# Patient Record
Sex: Male | Born: 1950
Health system: Southern US, Community
[De-identification: ages and names within clinical notes are randomized; demographics above are authoritative.]

## PROBLEM LIST (undated history)

## (undated) DIAGNOSIS — F419 Anxiety disorder, unspecified: Secondary | ICD-10-CM

## (undated) DIAGNOSIS — N529 Male erectile dysfunction, unspecified: Secondary | ICD-10-CM

## (undated) DIAGNOSIS — H269 Unspecified cataract: Secondary | ICD-10-CM

## (undated) DIAGNOSIS — M8580 Other specified disorders of bone density and structure, unspecified site: Secondary | ICD-10-CM

## (undated) DIAGNOSIS — F329 Major depressive disorder, single episode, unspecified: Secondary | ICD-10-CM

## (undated) DIAGNOSIS — K649 Unspecified hemorrhoids: Secondary | ICD-10-CM

## (undated) DIAGNOSIS — K219 Gastro-esophageal reflux disease without esophagitis: Secondary | ICD-10-CM

## (undated) DIAGNOSIS — N400 Enlarged prostate without lower urinary tract symptoms: Secondary | ICD-10-CM

## (undated) DIAGNOSIS — I10 Essential (primary) hypertension: Secondary | ICD-10-CM

## (undated) DIAGNOSIS — G4700 Insomnia, unspecified: Secondary | ICD-10-CM

## (undated) DIAGNOSIS — E785 Hyperlipidemia, unspecified: Secondary | ICD-10-CM

## (undated) DIAGNOSIS — F32A Depression, unspecified: Secondary | ICD-10-CM

## (undated) HISTORY — DX: Unspecified hemorrhoids: K64.9

## (undated) HISTORY — DX: Male erectile dysfunction, unspecified: N52.9

## (undated) HISTORY — DX: Essential (primary) hypertension: I10

## (undated) HISTORY — PX: FINGER FRACTURE SURGERY: SHX638

## (undated) HISTORY — DX: Benign prostatic hyperplasia without lower urinary tract symptoms: N40.0

## (undated) HISTORY — PX: COLONOSCOPY: SHX174

## (undated) HISTORY — DX: Gastro-esophageal reflux disease without esophagitis: K21.9

## (undated) HISTORY — DX: Insomnia, unspecified: G47.00

## (undated) HISTORY — DX: Other specified disorders of bone density and structure, unspecified site: M85.80

## (undated) HISTORY — DX: Anxiety disorder, unspecified: F41.9

## (undated) HISTORY — DX: Unspecified cataract: H26.9

## (undated) HISTORY — DX: Hyperlipidemia, unspecified: E78.5

---

## 1999-03-17 ENCOUNTER — Emergency Department (HOSPITAL_COMMUNITY): Admission: EM | Admit: 1999-03-17 | Discharge: 1999-03-17 | Payer: Self-pay | Admitting: Emergency Medicine

## 1999-11-11 ENCOUNTER — Observation Stay (HOSPITAL_COMMUNITY): Admission: EM | Admit: 1999-11-11 | Discharge: 1999-11-12 | Payer: Self-pay | Admitting: Emergency Medicine

## 1999-11-11 ENCOUNTER — Encounter: Payer: Self-pay | Admitting: Emergency Medicine

## 2004-02-08 ENCOUNTER — Ambulatory Visit (HOSPITAL_COMMUNITY): Admission: RE | Admit: 2004-02-08 | Discharge: 2004-02-08 | Payer: Self-pay | Admitting: Family Medicine

## 2004-02-22 ENCOUNTER — Ambulatory Visit (HOSPITAL_COMMUNITY): Admission: RE | Admit: 2004-02-22 | Discharge: 2004-02-22 | Payer: Self-pay | Admitting: Family Medicine

## 2008-04-29 ENCOUNTER — Ambulatory Visit: Payer: Self-pay | Admitting: Internal Medicine

## 2008-05-12 ENCOUNTER — Ambulatory Visit: Payer: Self-pay | Admitting: Internal Medicine

## 2012-02-10 ENCOUNTER — Encounter: Payer: Self-pay | Admitting: Internal Medicine

## 2012-02-11 ENCOUNTER — Ambulatory Visit (INDEPENDENT_AMBULATORY_CARE_PROVIDER_SITE_OTHER): Payer: 59 | Admitting: Internal Medicine

## 2012-02-11 ENCOUNTER — Encounter: Payer: Self-pay | Admitting: Internal Medicine

## 2012-02-11 VITALS — BP 150/90 | HR 81 | Temp 98.2°F | Ht 67.0 in | Wt 180.8 lb

## 2012-02-11 DIAGNOSIS — I10 Essential (primary) hypertension: Secondary | ICD-10-CM

## 2012-02-11 DIAGNOSIS — R05 Cough: Secondary | ICD-10-CM

## 2012-02-11 MED ORDER — NEBIVOLOL HCL 5 MG PO TABS: 5.0000 mg | ORAL_TABLET | Freq: Every day | ORAL | Status: DC

## 2012-02-11 MED ORDER — TRAMADOL HCL 50 MG PO TABS: ORAL_TABLET | ORAL | Status: AC

## 2012-02-11 NOTE — Patient Instructions (Addendum)
Most likely the cough at this point is being made worse by your lisinopril which you should stop   bystolic 5 mg one daily   Take delsym two tsp every 12 hours and supplement if needed with  tramadol 50 mg up to 2 every 4 hours to suppress the urge to cough. Swallowing water or using ice chips/non mint and menthol containing candies (such as lifesavers or sugarless jolly ranchers) are also effective.  You should rest your voice and avoid activities that you know make you cough.  Once you have eliminated the cough for 3 straight days try reducing the tramadol first,  then the delsym as tolerated.   Prilosec 20mg  Take 30-60 min before first meal of the day until no longer coughing or needing cough medicine   GERD (REFLUX)  is an extremely common cause of respiratory symptoms(just like yours) , many times with no significant heartburn at all.    It can be treated with medication, but also with lifestyle changes including avoidance of late meals, excessive alcohol, smoking cessation, and avoid fatty foods, chocolate, peppermint, colas, red wine, and acidic juices such as orange juice.  NO MINT OR MENTHOL PRODUCTS SO NO COUGH DROPS  USE SUGARLESS CANDY INSTEAD (jolley ranchers or Stover's)  NO OIL BASED VITAMINS - use powdered substitutes.

## 2012-02-11 NOTE — Progress Notes (Signed)
  Subjective:    Patient ID: Samuel Huang, male    DOB: 02-Nov-1950   MRN: [id number]  HPI  57 yowm quit smoking 1982 and no respiratory issues until March 2013 dx of Influenza B and rx as outpt and coughed ever since so referred 02/11/2012 by Dr Tiburcio Pea to pulmonary clinic.   02/11/2012 1st pulmonary ov/ Samuel Huang on ACEI cc persistent daily dry cough never while sleeping and takes his breathing with gagging from it at one time no assoc nasal symptoms, hb, wheezing and only sob when coughing.  Multiple abx / cough syrups/ pred/inhalers/ nl cxr   Sleeping ok without nocturnal  or early am exacerbation  of respiratory  c/o's or need for noct saba. Also denies any obvious fluctuation of symptoms with weather or environmental changes or other aggravating or alleviating factors except as outlined above   Review of Systems  Constitutional: Positive for appetite change. Negative for fever, chills, activity change and unexpected weight change.  HENT: Negative for ear pain, congestion, sore throat, rhinorrhea, sneezing, trouble swallowing, dental problem, voice change and postnasal drip.   Eyes: Negative for visual disturbance.  Respiratory: Positive for cough and shortness of breath. Negative for choking.   Cardiovascular: Negative for chest pain and leg swelling.  Gastrointestinal: Negative for nausea, vomiting and abdominal pain.  Genitourinary: Negative for difficulty urinating.  Musculoskeletal: Negative for arthralgias.  Skin: Negative for rash.  Psychiatric/Behavioral: Negative for behavioral problems and confusion.       Objective:   Physical Exam Pleasant amb wm with classic voice fatigue and pseudowheeze Wt 180 02/11/2012  HEENT: nl dentition, turbinates, and orophanx. Nl external ear canals without cough reflex   NECK :  without JVD/Nodes/TM/ nl carotid upstrokes bilaterally   LUNGS: no acc muscle use, clear to A and P bilaterally without cough on insp or exp maneuvers   CV:  RRR   no s3 or murmur or increase in P2, no edema   ABD:  soft and nontender with nl excursion in the supine position. No bruits or organomegaly, bowel sounds nl  MS:  warm without deformities, calf tenderness, cyanosis or clubbing  SKIN: warm and dry without lesions    NEURO:  alert, approp, no deficits   cxr 01/21/12 "Neg chest" per radiology report       Assessment & Plan:

## 2012-02-12 DIAGNOSIS — I10 Essential (primary) hypertension: Secondary | ICD-10-CM | POA: Insufficient documentation

## 2012-02-12 NOTE — Assessment & Plan Note (Signed)
ACE inhibitors are problematic in  pts with airway complaints because  even experienced pulmonologists can't always distinguish ace effects from copd/asthma/pnds/ allergies etc.  By themselves they don't actually cause a problem, much like oxygen can't by itself start a fire, but they certainly serve as a powerful catalyst or enhancer for any "fire"  or inflammatory process in the upper airway, be it caused by an ET  tube or more commonly reflux (especially in the obese or pts with known GERD or who are on biphoshonates) or URI's, due to interference with bradykinin clearance.  The effects of acei on bradykinin levels occurs in 100% of pt's on acei (unless they surreptitiously stop the med!) but the classic cough is only reported in 5%.  This leaves 95% of pts on acei's  with a variety of syndromes including no identifiable symptom in most  vs non-specific symptoms that wax and wane depending on what other insult is occuring at the level of the upper airway.   Try change to bystolic 5 mg daily and then f/u with Dr Dominica Severin if cough resolves for maint rx for hbp off ACEI indefinitely

## 2012-02-12 NOTE — Assessment & Plan Note (Signed)
The most common causes of chronic cough in immunocompetent adults include the following: upper airway cough syndrome (UACS), previously referred to as postnasal drip syndrome (PNDS), which is caused by variety of rhinosinus conditions; (2) asthma; (3) GERD; (4) chronic bronchitis from cigarette smoking or other inhaled environmental irritants; (5) nonasthmatic eosinophilic bronchitis; and (6) bronchiectasis.   These conditions, singly or in combination, have accounted for up to 94% of the causes of chronic cough in prospective studies.   Other conditions have constituted no >6% of the causes in prospective studies These have included bronchogenic carcinoma, chronic interstitial pneumonia, sarcoidosis, left ventricular failure, ACEI-induced cough, and aspiration from a condition associated with pharyngeal dysfunction.   .Chronic cough is often simultaneously caused by more than one condition. A single cause has been found from 38 to 82% of the time, multiple causes from 18 to 62%. Multiply caused cough has been the result of three diseases up to 42% of the time.     This is most likely  Classic Upper airway cough syndrome, so named because it's frequently impossible to sort out how much is  CR/sinusitis with freq throat clearing (which can be related to primary GERD)   vs  causing  secondary (" extra esophageal")  GERD from wide swings in gastric pressure that occur with throat clearing, often  promoting self use of mint and menthol lozenges that reduce the lower esophageal sphincter tone and exacerbate the problem further in a cyclical fashion.   These are the same pts (now being labeled as having "irritable larynx syndrome" by some cough centers) who not infrequently have a history of having failed to tolerate ace inhibitors,  dry powder inhalers or biphosphonates or report having atypical reflux symptoms that don't respond to standard doses of PPI , and are easily confused as having aecopd or asthma  flares by even experienced allergists/ pulmonologists.   He is on acei and probably has cough induced gerd induced cough cycle going now, with acei interfering with bradykinin clearance and fueling the fire on this basis > try off then regroup.  See instructions for specific recommendations which were reviewed directly with the patient who was given a copy with highlighter outlining the key components.

## 2013-04-30 ENCOUNTER — Ambulatory Visit
Admission: RE | Admit: 2013-04-30 | Discharge: 2013-04-30 | Disposition: A | Discharge status: Home / Self Care | Payer: BC Managed Care – PPO | Source: Ambulatory Visit | Attending: Family Medicine | Admitting: Family Medicine

## 2013-04-30 ENCOUNTER — Other Ambulatory Visit: Payer: Self-pay | Admitting: Family Medicine

## 2013-04-30 DIAGNOSIS — M5412 Radiculopathy, cervical region: Secondary | ICD-10-CM

## 2014-06-01 ENCOUNTER — Encounter (HOSPITAL_BASED_OUTPATIENT_CLINIC_OR_DEPARTMENT_OTHER): Payer: Self-pay | Admitting: Emergency Medicine

## 2014-06-01 ENCOUNTER — Emergency Department (HOSPITAL_BASED_OUTPATIENT_CLINIC_OR_DEPARTMENT_OTHER)
Admission: EM | Admit: 2014-06-01 | Discharge: 2014-06-01 | Disposition: A | Discharge status: Home / Self Care | Payer: BC Managed Care – PPO | Attending: Emergency Medicine | Admitting: Emergency Medicine

## 2014-06-01 DIAGNOSIS — E785 Hyperlipidemia, unspecified: Secondary | ICD-10-CM | POA: Insufficient documentation

## 2014-06-01 DIAGNOSIS — Z79899 Other long term (current) drug therapy: Secondary | ICD-10-CM | POA: Insufficient documentation

## 2014-06-01 DIAGNOSIS — Z8739 Personal history of other diseases of the musculoskeletal system and connective tissue: Secondary | ICD-10-CM | POA: Diagnosis not present

## 2014-06-01 DIAGNOSIS — F32A Depression, unspecified: Secondary | ICD-10-CM

## 2014-06-01 DIAGNOSIS — F3289 Other specified depressive episodes: Secondary | ICD-10-CM | POA: Insufficient documentation

## 2014-06-01 DIAGNOSIS — I10 Essential (primary) hypertension: Secondary | ICD-10-CM | POA: Diagnosis not present

## 2014-06-01 DIAGNOSIS — Z7982 Long term (current) use of aspirin: Secondary | ICD-10-CM | POA: Diagnosis not present

## 2014-06-01 DIAGNOSIS — F411 Generalized anxiety disorder: Secondary | ICD-10-CM | POA: Insufficient documentation

## 2014-06-01 DIAGNOSIS — F329 Major depressive disorder, single episode, unspecified: Secondary | ICD-10-CM

## 2014-06-01 DIAGNOSIS — Z87891 Personal history of nicotine dependence: Secondary | ICD-10-CM | POA: Insufficient documentation

## 2014-06-01 DIAGNOSIS — Z880 Allergy status to penicillin: Secondary | ICD-10-CM | POA: Insufficient documentation

## 2014-06-01 DIAGNOSIS — Z8719 Personal history of other diseases of the digestive system: Secondary | ICD-10-CM | POA: Diagnosis not present

## 2014-06-01 HISTORY — DX: Major depressive disorder, single episode, unspecified: F32.9

## 2014-06-01 HISTORY — DX: Depression, unspecified: F32.A

## 2014-06-01 MED ORDER — DULOXETINE HCL 30 MG PO CPEP: 30.0000 mg | ORAL_CAPSULE | Freq: Every day | ORAL | Status: DC

## 2014-06-01 NOTE — ED Provider Notes (Signed)
CSN: [id number]     Arrival date & time 06/01/14  0849 History   First MD Initiated Contact with Patient 06/01/14 [phone]     Chief Complaint  Patient presents with  . Depression      The history is provided by the patient.   patient presents with depressive symptoms.  Reports anorexia, tearfulness, insomnia.  He states this has been worsening over the past month.  He states he is worried about his upcoming retirement.  He also has a new boss has been causing a lot of stress at work secondary to performance measures.  Patient states he has a history depression 25 years ago.  He denies suicidal or homicidal thoughts.  No hallucinations.  He called his primary care doctor but can't until next week.  He states he had difficulty sleeping last night.  Past Medical History  Diagnosis Date  . Hypertension   . GERD (gastroesophageal reflux disease)   . Hyperlipidemia   . Anxiety   . Osteopenia   . Vitamin D deficiency   . Depression    History reviewed. No pertinent past surgical history. No family history on file. History  Substance Use Topics  . Smoking status: Former Smoker -- 0.30 packs/day for 3 years    Types: Cigarettes    Quit date: 10/21/1978  . Smokeless tobacco: Never Used  . Alcohol Use: Yes     Comment: occ ETOH only    Review of Systems  All other systems reviewed and are negative.     Allergies  Penicillins  Home Medications   Prior to Admission medications   Medication Sig Start Date End Date Taking? Authorizing Provider  aspirin 81 MG tablet Take 81 mg by mouth at bedtime.    Historical Provider, MD  DULoxetine (CYMBALTA) 30 MG capsule Take 1 capsule (30 mg total) by mouth daily. 06/01/14   Lyanne CoKevin M Robertt Buda, MD  nebivolol (BYSTOLIC) 5 MG tablet Take 1 tablet (5 mg total) by mouth daily. 02/11/12   Nyoka CowdenMichael B Wert, MD  simvastatin (ZOCOR) 20 MG tablet Take 1 tablet by mouth daily. 01/29/12   Historical Provider, MD   BP 167/86  Pulse 58  Temp(Src) 98 F (36.7 C)  (Oral)  Resp 16  Ht 5\' 7"  (1.702 m)  Wt 176 lb (79.833 kg)  BMI 27.56 kg/m2  SpO2 98% Physical Exam  Nursing note and vitals reviewed. Constitutional: He is oriented to person, place, and time. He appears well-developed and well-nourished.  HENT:  Head: Normocephalic.  Eyes: EOM are normal.  Neck: Normal range of motion.  Pulmonary/Chest: Effort normal.  Abdominal: He exhibits no distension.  Musculoskeletal: Normal range of motion.  Neurological: He is alert and oriented to person, place, and time.  Psychiatric:  Tearful, good insight. No HI or SI    ED Course  Procedures (including critical care time) Labs Review Labs Reviewed - No data to display  Imaging Review No results found.   EKG Interpretation None      MDM   Final diagnoses:  Depression    Depression without suicidal or homicidal thoughts.  No indication for inpatient management.  Patient given strict instructions to return to the ER for any new or worsening symptoms including but not limited to suicidal thoughts.  Patient be started on an antidepressant at this time.  He understands the potential for increased suicidal thoughts while initiating this new medication    Lyanne CoKevin M Amayah Staheli, MD 06/01/14 [phone]

## 2014-06-01 NOTE — Discharge Instructions (Signed)

## 2014-06-01 NOTE — ED Notes (Signed)
MD at bedside. 

## 2014-06-01 NOTE — ED Notes (Addendum)
Depreesion x 1 month and past 2 days have been worse.  Crying, insomnia, disinterest in activities, decreased appetite and stressed about upcoming retirement.  Denies SI, however states he wishes he could "break my arm or something so I wouldn't have to go to work". Hx depression 25 years ago.

## 2015-01-31 ENCOUNTER — Other Ambulatory Visit: Payer: Self-pay | Admitting: Family Medicine

## 2015-01-31 ENCOUNTER — Ambulatory Visit
Admission: RE | Admit: 2015-01-31 | Discharge: 2015-01-31 | Disposition: A | Discharge status: Home / Self Care | Payer: 59 | Source: Ambulatory Visit | Attending: Family Medicine | Admitting: Family Medicine

## 2015-01-31 DIAGNOSIS — R059 Cough, unspecified: Secondary | ICD-10-CM

## 2015-01-31 DIAGNOSIS — R509 Fever, unspecified: Secondary | ICD-10-CM

## 2015-01-31 DIAGNOSIS — R05 Cough: Secondary | ICD-10-CM

## 2015-11-28 DIAGNOSIS — M722 Plantar fascial fibromatosis: Secondary | ICD-10-CM | POA: Diagnosis not present

## 2015-11-28 DIAGNOSIS — E782 Mixed hyperlipidemia: Secondary | ICD-10-CM | POA: Diagnosis not present

## 2015-11-28 DIAGNOSIS — E559 Vitamin D deficiency, unspecified: Secondary | ICD-10-CM | POA: Diagnosis not present

## 2015-11-28 DIAGNOSIS — I10 Essential (primary) hypertension: Secondary | ICD-10-CM | POA: Diagnosis not present

## 2015-11-28 DIAGNOSIS — N529 Male erectile dysfunction, unspecified: Secondary | ICD-10-CM | POA: Diagnosis not present

## 2016-07-04 DIAGNOSIS — L309 Dermatitis, unspecified: Secondary | ICD-10-CM | POA: Diagnosis not present

## 2016-07-04 DIAGNOSIS — I10 Essential (primary) hypertension: Secondary | ICD-10-CM | POA: Diagnosis not present

## 2016-07-04 DIAGNOSIS — E782 Mixed hyperlipidemia: Secondary | ICD-10-CM | POA: Diagnosis not present

## 2016-07-04 DIAGNOSIS — E559 Vitamin D deficiency, unspecified: Secondary | ICD-10-CM | POA: Diagnosis not present

## 2016-11-01 DIAGNOSIS — R109 Unspecified abdominal pain: Secondary | ICD-10-CM | POA: Diagnosis not present

## 2016-11-01 DIAGNOSIS — R1013 Epigastric pain: Secondary | ICD-10-CM | POA: Diagnosis not present

## 2016-11-19 ENCOUNTER — Encounter: Payer: Self-pay | Admitting: Internal Medicine

## 2016-12-17 ENCOUNTER — Other Ambulatory Visit: Payer: Self-pay

## 2016-12-27 ENCOUNTER — Ambulatory Visit (INDEPENDENT_AMBULATORY_CARE_PROVIDER_SITE_OTHER): Payer: 59 | Admitting: Internal Medicine

## 2016-12-27 ENCOUNTER — Encounter (INDEPENDENT_AMBULATORY_CARE_PROVIDER_SITE_OTHER): Payer: Self-pay

## 2016-12-27 ENCOUNTER — Encounter: Payer: Self-pay | Admitting: Internal Medicine

## 2016-12-27 VITALS — BP 148/78 | HR 72 | Ht 67.0 in | Wt 180.0 lb

## 2016-12-27 DIAGNOSIS — A09 Infectious gastroenteritis and colitis, unspecified: Secondary | ICD-10-CM

## 2016-12-27 DIAGNOSIS — K529 Noninfective gastroenteritis and colitis, unspecified: Secondary | ICD-10-CM

## 2016-12-27 DIAGNOSIS — K588 Other irritable bowel syndrome: Secondary | ICD-10-CM | POA: Diagnosis not present

## 2016-12-27 NOTE — Progress Notes (Signed)
   Samuel CulverCharles M Huang 66 y.o. [birth year] [id number]  Assessment & Plan:   Encounter Diagnoses  Name Primary?  . Gastroenteritis presumed infectious Yes  . irritable bowel syndrome - post infectious     I think he is recovering from gastroenteritis x 1-2 (seems like had second bout w/ food poisoning)  If he fails to resolve he will call me back and we can consider colonoscopy but I suspect he can wait until 2019 for routine repeat based upon what I know today  I appreciate the opportunity to care for this patient.  BJ:YNWGNF,AOZHYCc:SWAYNE,DAVID W, MD  Subjective:   Chief Complaint: LUQ pain and diarrhea  HPI In Jan he had nausea/vomitinh LUQ pain and diarrhea Saw PCP - labs ok Tried some medication x 10 d ? Abx) not much better but eventually less loose stools and lUQ pain but still has some pc bloating, gas and abd discomfort LUQ Appetite ok Noone else sick then but 1 week ago he and wife ill x 24 hrs w/ nausea vomiting and diarrhea No recent Abx before Jan and no med changes Last colonoscopy 2009 negative screening  GI ROS o/w neg  Medications, allergies, past medical history, past surgical history, family history and social history are reviewed and updated in the EMR.  Review of Systems This is positive for those things mentiones in the HPI. All other review of systems are negative.   Objective:   Physical Exam @BP  (!) 148/78   Pulse 72   Ht 5\' 7"  (1.702 m)   Wt 180 lb (81.6 kg)   BMI 28.19 kg/m @  General:  Well-developed, well-nourished and in no acute distress Eyes:  anicteric. ENT:   Mouth and posterior pharynx free of lesions.  Neck:   supple w/o thyromegaly or mass.  Lungs: Clear to auscultation bilaterally. Heart:  S1S2, no rubs, murmurs, gallops. Abdomen:  soft, non-tender, no hepatosplenomegaly, hernia, or mass and BS+.  Lymph:  no cervical or supraclavicular adenopathy. Extremities:   no edema, cyanosis or clubbing Skin   no rash. Neuro:  A&O x 3.  Psych:    appropriate mood and  Affect.   Data Reviewed: As per HPI Labs 10/2016 NL Hgb  NL renal fx NL LFT

## 2016-12-27 NOTE — Patient Instructions (Signed)
Today we are giving you VSL #3 , take one daily and follow up with us as needed.   I appreciate the opportunity to care for you. Stan Headarl Gessner, MD, [facility] Facility[facility]AC[facility]

## 2016-12-28 ENCOUNTER — Encounter: Payer: Self-pay | Admitting: Internal Medicine

## 2017-02-18 DIAGNOSIS — N529 Male erectile dysfunction, unspecified: Secondary | ICD-10-CM | POA: Diagnosis not present

## 2017-02-18 DIAGNOSIS — Z1389 Encounter for screening for other disorder: Secondary | ICD-10-CM | POA: Diagnosis not present

## 2017-02-18 DIAGNOSIS — Z125 Encounter for screening for malignant neoplasm of prostate: Secondary | ICD-10-CM | POA: Diagnosis not present

## 2017-02-18 DIAGNOSIS — E782 Mixed hyperlipidemia: Secondary | ICD-10-CM | POA: Diagnosis not present

## 2017-02-18 DIAGNOSIS — Z0000 Encounter for general adult medical examination without abnormal findings: Secondary | ICD-10-CM | POA: Diagnosis not present

## 2017-02-18 DIAGNOSIS — Z136 Encounter for screening for cardiovascular disorders: Secondary | ICD-10-CM | POA: Diagnosis not present

## 2017-02-18 DIAGNOSIS — R7309 Other abnormal glucose: Secondary | ICD-10-CM | POA: Diagnosis not present

## 2017-02-18 DIAGNOSIS — I10 Essential (primary) hypertension: Secondary | ICD-10-CM | POA: Diagnosis not present

## 2017-02-18 DIAGNOSIS — Z1211 Encounter for screening for malignant neoplasm of colon: Secondary | ICD-10-CM | POA: Diagnosis not present

## 2017-02-18 DIAGNOSIS — M8588 Other specified disorders of bone density and structure, other site: Secondary | ICD-10-CM | POA: Diagnosis not present

## 2017-02-18 DIAGNOSIS — Z23 Encounter for immunization: Secondary | ICD-10-CM | POA: Diagnosis not present

## 2017-02-18 DIAGNOSIS — E559 Vitamin D deficiency, unspecified: Secondary | ICD-10-CM | POA: Diagnosis not present

## 2017-02-20 ENCOUNTER — Other Ambulatory Visit: Payer: Self-pay | Admitting: Family Medicine

## 2017-02-20 DIAGNOSIS — Z136 Encounter for screening for cardiovascular disorders: Secondary | ICD-10-CM

## 2017-03-11 ENCOUNTER — Ambulatory Visit: Payer: 59

## 2017-03-14 ENCOUNTER — Ambulatory Visit
Admission: RE | Admit: 2017-03-14 | Discharge: 2017-03-14 | Disposition: A | Discharge status: Home / Self Care | Payer: 59 | Source: Ambulatory Visit | Attending: Family Medicine | Admitting: Family Medicine

## 2017-03-14 DIAGNOSIS — Z136 Encounter for screening for cardiovascular disorders: Secondary | ICD-10-CM

## 2017-03-31 DIAGNOSIS — M8588 Other specified disorders of bone density and structure, other site: Secondary | ICD-10-CM | POA: Diagnosis not present

## 2017-09-16 DIAGNOSIS — R1031 Right lower quadrant pain: Secondary | ICD-10-CM | POA: Diagnosis not present

## 2017-09-16 DIAGNOSIS — N529 Male erectile dysfunction, unspecified: Secondary | ICD-10-CM | POA: Diagnosis not present

## 2017-09-16 DIAGNOSIS — E782 Mixed hyperlipidemia: Secondary | ICD-10-CM | POA: Diagnosis not present

## 2017-09-16 DIAGNOSIS — E559 Vitamin D deficiency, unspecified: Secondary | ICD-10-CM | POA: Diagnosis not present

## 2017-09-16 DIAGNOSIS — Z23 Encounter for immunization: Secondary | ICD-10-CM | POA: Diagnosis not present

## 2017-09-16 DIAGNOSIS — N486 Induration penis plastica: Secondary | ICD-10-CM | POA: Diagnosis not present

## 2017-09-16 DIAGNOSIS — M85851 Other specified disorders of bone density and structure, right thigh: Secondary | ICD-10-CM | POA: Diagnosis not present

## 2017-09-16 DIAGNOSIS — R7303 Prediabetes: Secondary | ICD-10-CM | POA: Diagnosis not present

## 2017-09-16 DIAGNOSIS — I10 Essential (primary) hypertension: Secondary | ICD-10-CM | POA: Diagnosis not present

## 2017-09-16 DIAGNOSIS — R945 Abnormal results of liver function studies: Secondary | ICD-10-CM | POA: Diagnosis not present

## 2017-10-02 DIAGNOSIS — R945 Abnormal results of liver function studies: Secondary | ICD-10-CM | POA: Diagnosis not present

## 2017-11-17 DIAGNOSIS — Z125 Encounter for screening for malignant neoplasm of prostate: Secondary | ICD-10-CM | POA: Diagnosis not present

## 2017-11-17 DIAGNOSIS — N486 Induration penis plastica: Secondary | ICD-10-CM | POA: Diagnosis not present

## 2017-11-17 DIAGNOSIS — N5201 Erectile dysfunction due to arterial insufficiency: Secondary | ICD-10-CM | POA: Diagnosis not present

## 2018-05-05 DIAGNOSIS — E1169 Type 2 diabetes mellitus with other specified complication: Secondary | ICD-10-CM | POA: Diagnosis not present

## 2018-05-05 DIAGNOSIS — Z125 Encounter for screening for malignant neoplasm of prostate: Secondary | ICD-10-CM | POA: Diagnosis not present

## 2018-05-05 DIAGNOSIS — Z1211 Encounter for screening for malignant neoplasm of colon: Secondary | ICD-10-CM | POA: Diagnosis not present

## 2018-05-05 DIAGNOSIS — E782 Mixed hyperlipidemia: Secondary | ICD-10-CM | POA: Diagnosis not present

## 2018-05-05 DIAGNOSIS — N529 Male erectile dysfunction, unspecified: Secondary | ICD-10-CM | POA: Diagnosis not present

## 2018-05-05 DIAGNOSIS — Z23 Encounter for immunization: Secondary | ICD-10-CM | POA: Diagnosis not present

## 2018-05-05 DIAGNOSIS — M85851 Other specified disorders of bone density and structure, right thigh: Secondary | ICD-10-CM | POA: Diagnosis not present

## 2018-05-05 DIAGNOSIS — Z0000 Encounter for general adult medical examination without abnormal findings: Secondary | ICD-10-CM | POA: Diagnosis not present

## 2018-05-05 DIAGNOSIS — I10 Essential (primary) hypertension: Secondary | ICD-10-CM | POA: Diagnosis not present

## 2018-05-05 DIAGNOSIS — E559 Vitamin D deficiency, unspecified: Secondary | ICD-10-CM | POA: Diagnosis not present

## 2018-06-01 ENCOUNTER — Encounter: Payer: Self-pay | Admitting: Internal Medicine

## 2018-07-23 ENCOUNTER — Encounter: Payer: Medicare Other | Admitting: Internal Medicine

## 2018-08-09 IMAGING — US US AORTA SCREENING (MEDICARE)
1 series · 12 of 12 positions shown · non-contrast
Comparison: CT abdomen and pelvis - 02/08/2004

CLINICAL DATA: Male between 65-75 years of age with a smoking
history.

EXAM:
US ABDOMINAL AORTA MEDICARE SCREENING
TECHNIQUE: Ultrasound examination of the abdominal aorta was performed as a
screening evaluation for abdominal aortic aneurysm.

[Series 1: us aorta screening (medicare) · 0.30mm/px · 12 of 12 slices shown]
[im 1/12]
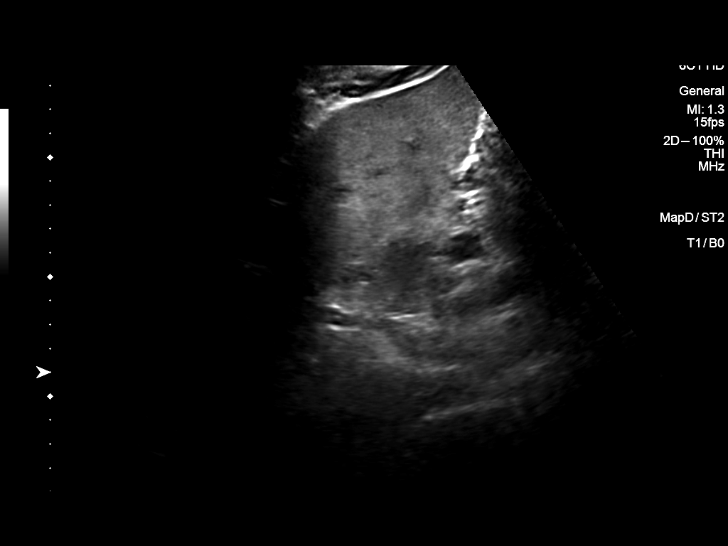
[im 2/12]
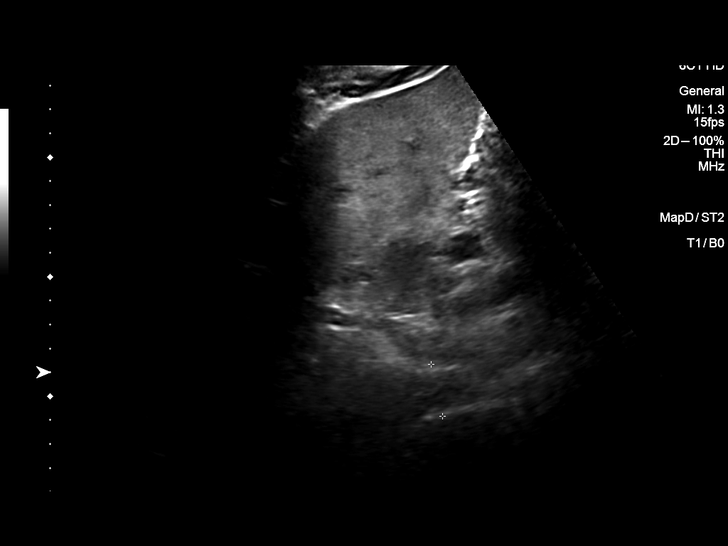
[im 3/12]
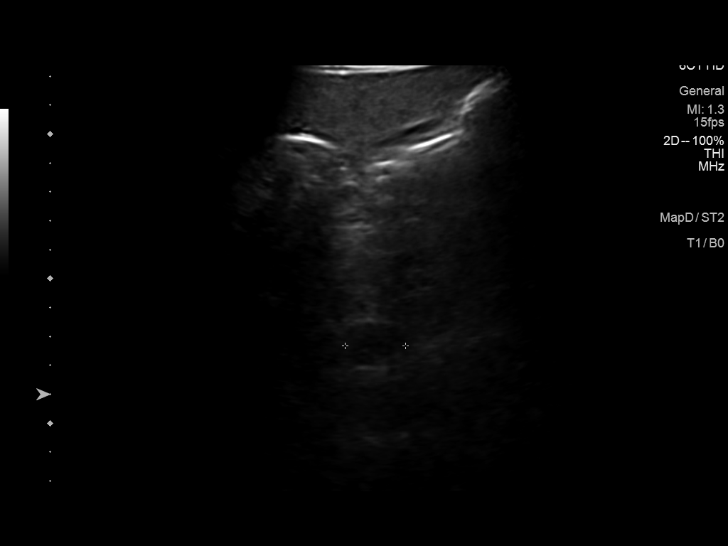
[im 4/12]
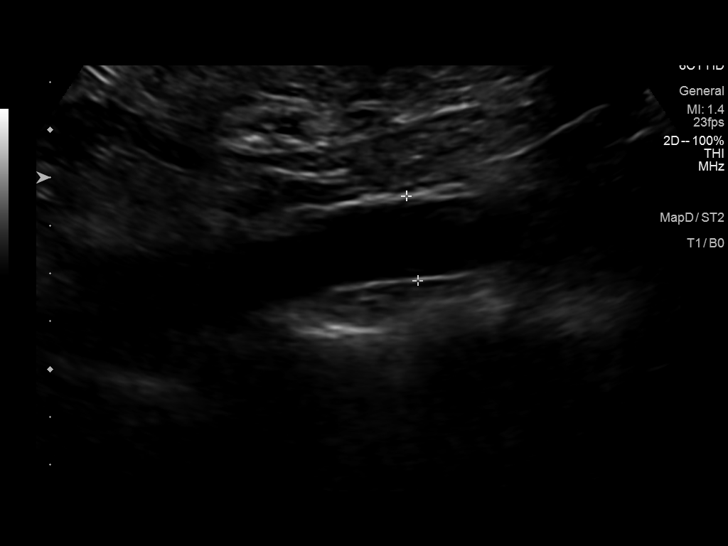
[im 5/12]
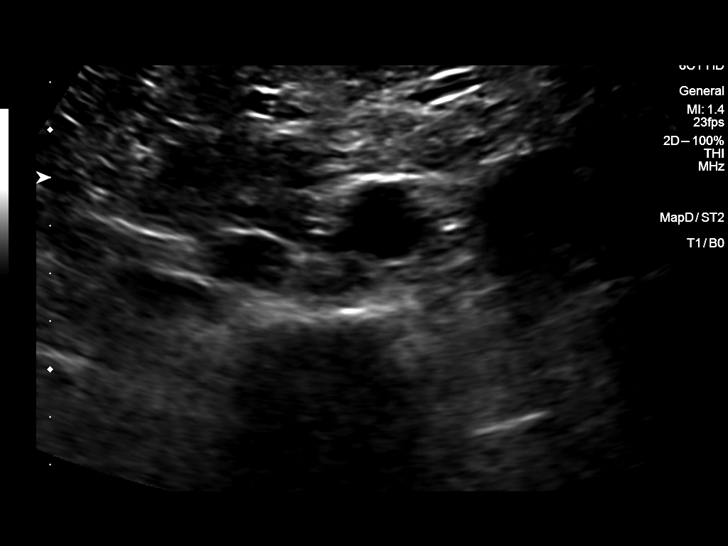
[im 6/12]
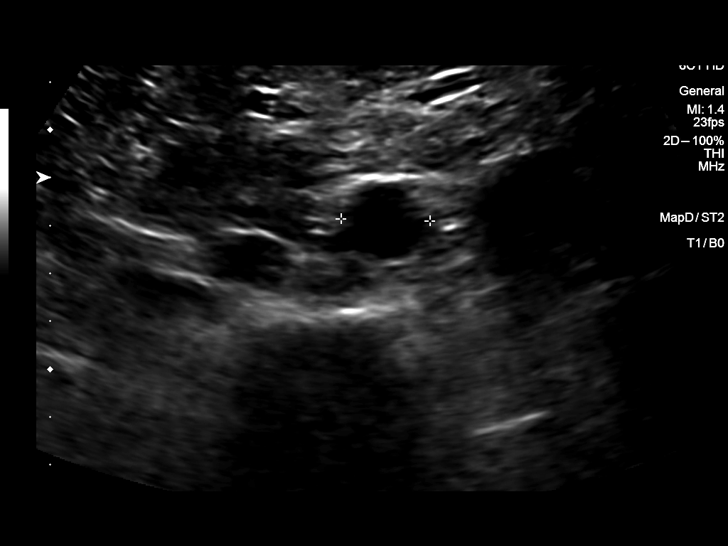
[im 7/12]
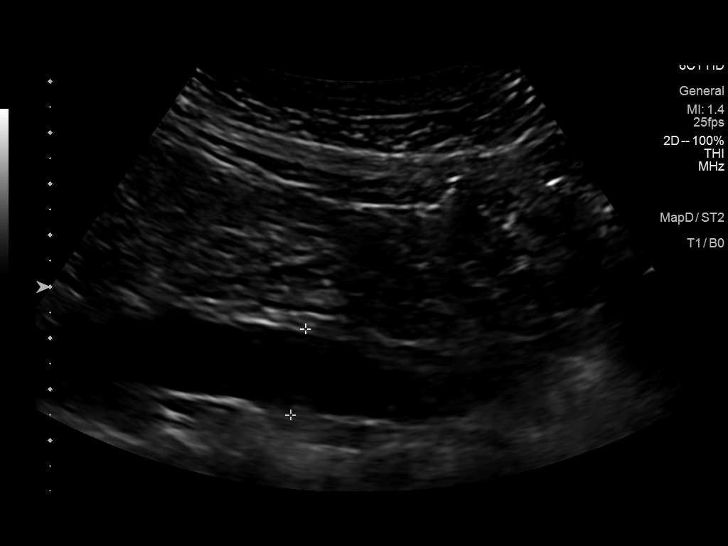
[im 8/12]
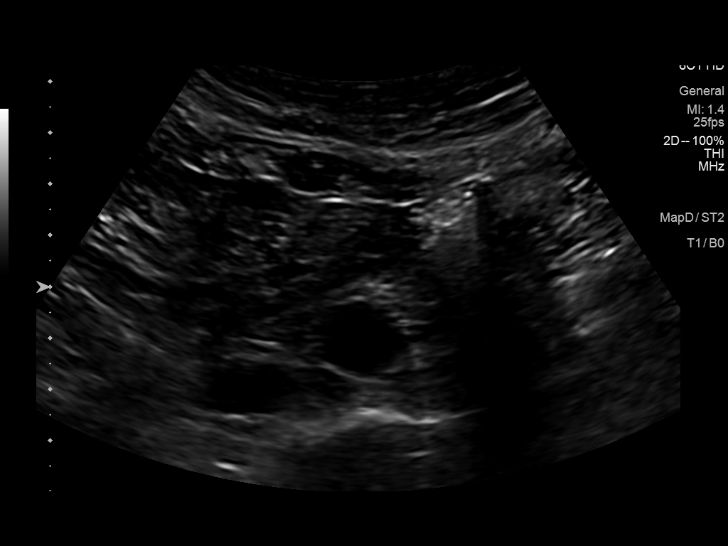
[im 9/12]
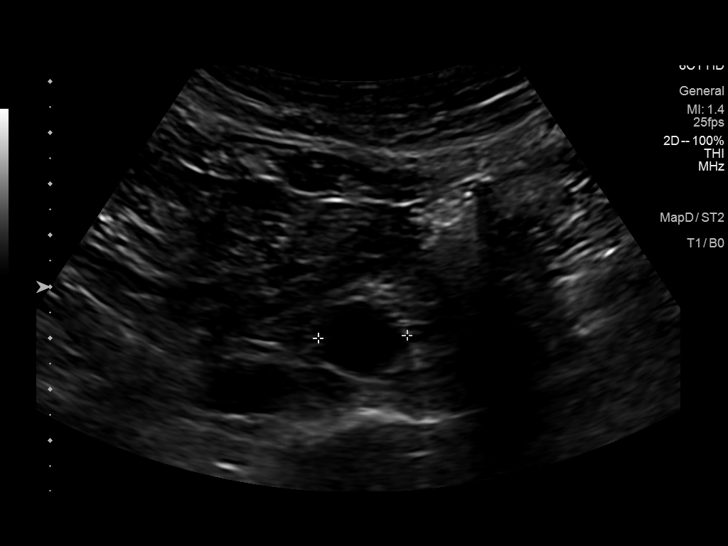
[im 10/12]
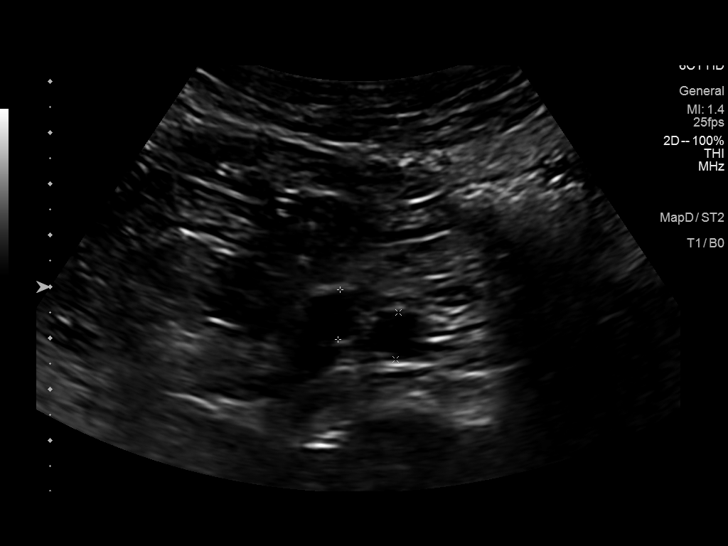
[im 11/12]
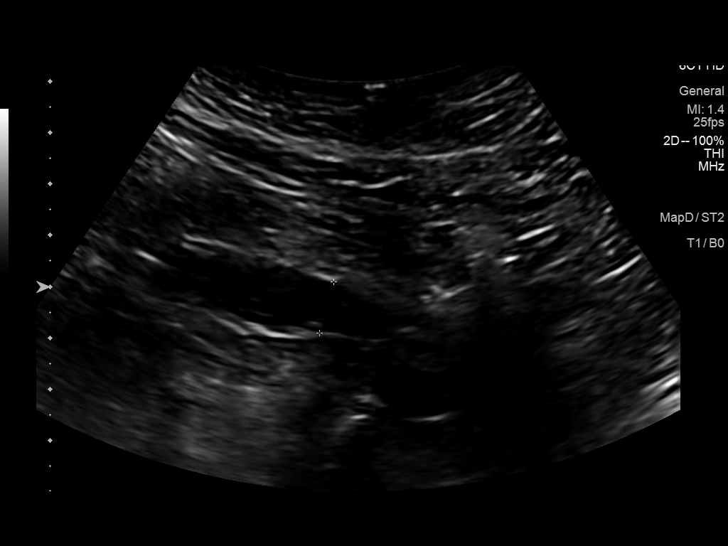
[im 12/12]
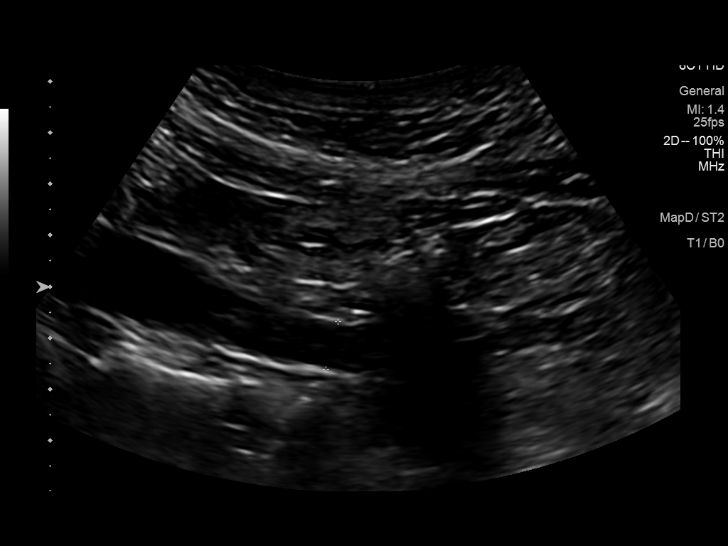

[12 of 12 positions shown; findings below may reference images not displayed]

FINDINGS: Abdominal aortic measurements as follows:

Proximal:  2.2 x 2.1 cm

Mid:  1.8 x 1.9 cm

Distal:  1.7 x 1.7 cm
IMPRESSION: No evidence of abdominal aortic aneurysm.

## 2018-08-14 ENCOUNTER — Encounter: Payer: Self-pay | Admitting: Internal Medicine

## 2018-09-08 ENCOUNTER — Encounter: Payer: Self-pay | Admitting: Internal Medicine

## 2018-09-08 ENCOUNTER — Ambulatory Visit (AMBULATORY_SURGERY_CENTER): Payer: Self-pay

## 2018-09-08 VITALS — Ht 66.0 in | Wt 179.6 lb

## 2018-09-08 DIAGNOSIS — Z1211 Encounter for screening for malignant neoplasm of colon: Secondary | ICD-10-CM

## 2018-09-08 NOTE — Progress Notes (Signed)
Denies allergies to eggs or soy products. Denies complication of anesthesia or sedation. Denies use of weight loss medication. Denies use of O2.   Emmi instructions declined.  

## 2018-09-21 ENCOUNTER — Encounter: Payer: Self-pay | Admitting: Internal Medicine

## 2018-09-21 ENCOUNTER — Ambulatory Visit (AMBULATORY_SURGERY_CENTER): Payer: 59 | Admitting: Internal Medicine

## 2018-09-21 VITALS — BP 111/67 | HR 66 | Temp 97.5°F | Resp 13 | Ht 66.0 in | Wt 179.0 lb

## 2018-09-21 DIAGNOSIS — Z1211 Encounter for screening for malignant neoplasm of colon: Secondary | ICD-10-CM | POA: Diagnosis not present

## 2018-09-21 MED ORDER — SODIUM CHLORIDE 0.9 % IV SOLN: 500.0000 mL | Freq: Once | INTRAVENOUS | Status: DC

## 2018-09-21 NOTE — Patient Instructions (Addendum)
No polyps or cancer seen.  Based upon your history (no polyps) and that in 10 years you would be older than 75 I do not recommend a routine repeat colonoscopy.  If you have problems then we would investigate (hope that is not necessary).  I appreciate the opportunity to care for you. Iva Booparl E. Gessner, MD, Clement[facility] GrahamFACG  Handouts:  Diverticulosis  YOU HAD AN ENDOSCOPIC PROCEDURE TODAY AT THE [facility]:   Refer to the procedure report that was given to you for any specific questions about what was found during the examination.  If the procedure report does not answer your questions, please call your gastroenterologist to clarify.  If you requested that your care partner not be given the details of your procedure findings, then the procedure report has been included in a sealed envelope for you to review at your convenience later.  YOU SHOULD EXPECT: Some feelings of bloating in the abdomen. Passage of more gas than usual.  Walking can help get rid of the air that was put into your GI tract during the procedure and reduce the bloating. If you had a lower endoscopy (such as a colonoscopy or flexible sigmoidoscopy) you may notice spotting of blood in your stool or on the toilet paper. If you underwent a bowel prep for your procedure, you may not have a normal bowel movement for a few days.  Please Note:  You might notice some irritation and congestion in your nose or some drainage.  This is from the oxygen used during your procedure.  There is no need for concern and it should clear up in a day or so.  SYMPTOMS TO REPORT IMMEDIATELY:   Following lower endoscopy (colonoscopy or flexible sigmoidoscopy):  Excessive amounts of blood in the stool  Significant tenderness or worsening of abdominal pains  Swelling of the abdomen that is new, acute  Fever of 100F or higher  For urgent or emergent issues, a gastroenterologist can be reached at any hour by calling [phone].   DIET:   We do recommend a small meal at first, but then you may proceed to your regular diet.  Drink plenty of fluids but you should avoid alcoholic beverages for 24 hours.  ACTIVITY:  You should plan to take it easy for the rest of today and you should NOT DRIVE or use heavy machinery until tomorrow (because of the sedation medicines used during the test).    FOLLOW UP: Our staff will call the number listed on your records the next business day following your procedure to check on you and address any questions or concerns that you may have regarding the information given to you following your procedure. If we do not reach you, we will leave a message.  However, if you are feeling well and you are not experiencing any problems, there is no need to return our call.  We will assume that you have returned to your regular daily activities without incident.  If any biopsies were taken you will be contacted by phone or by letter within the next 1-3 weeks.  Please call us at [phone] if you have not heard about the biopsies in 3 weeks.    SIGNATURES/CONFIDENTIALITY: You and/or your care partner have signed paperwork which will be entered into your electronic medical record.  These signatures attest to the fact that that the information above on your After Visit Summary has been reviewed and is understood.  Full responsibility of the confidentiality of this  discharge information lies with you and/or your care-partner. 

## 2018-09-21 NOTE — Op Note (Signed)
[facility] Patient Name: Samuel BellisCharles Huang Procedure Date: 09/21/2018 1:02 PM MRN: [id number] Endoscopist: Iva Booparl E Gessner , MD Age: [age] Referring MD:  Date of Birth: [birth year] Gender: Male Account #: [id number] Procedure:                Colonoscopy Indications:              Screening for colorectal malignant neoplasm, Last                            colonoscopy: 2009 Medicines:                Propofol per Anesthesia, Monitored Anesthesia Care Procedure:                Pre-Anesthesia Assessment:                           - Prior to the procedure, a History and Physical                            was performed, and patient medications and                            allergies were reviewed. The patient's tolerance of                            previous anesthesia was also reviewed. The risks                            and benefits of the procedure and the sedation                            options and risks were discussed with the patient.                            All questions were answered, and informed consent                            was obtained. Prior Anticoagulants: The patient has                            taken no previous anticoagulant or antiplatelet                            agents. ASA Grade Assessment: II - A patient with                            mild systemic disease. After reviewing the risks                            and benefits, the patient was deemed in                            satisfactory condition to undergo the procedure.  After obtaining informed consent, the colonoscope                            was passed under direct vision. Throughout the                            procedure, the patient's blood pressure, pulse, and                            oxygen saturations were monitored continuously. The                            Colonoscope was introduced through the anus and                            advanced to the the  cecum, identified by                            appendiceal orifice and ileocecal valve. The                            colonoscopy was performed without difficulty. The                            patient tolerated the procedure well. The quality                            of the bowel preparation was good. The ileocecal                            valve, appendiceal orifice, and rectum were                            photographed. The bowel preparation used was                            Miralax. Scope In: 1:09:50 PM Scope Out: 1:23:34 PM Scope Withdrawal Time: 0 hours 11 minutes 47 seconds  Total Procedure Duration: 0 hours 13 minutes 44 seconds  Findings:                 The perianal and digital rectal examinations were                            normal. Pertinent negatives include normal prostate                            (size, shape, and consistency).                           A few small-mouthed diverticula were found in the                            sigmoid colon.  The exam was otherwise without abnormality on                            direct and retroflexion views. Complications:            No immediate complications. Estimated Blood Loss:     Estimated blood loss: none. Impression:               - Diverticulosis in the sigmoid colon.                           - The examination was otherwise normal on direct                            and retroflexion views.                           - No specimens collected. Recommendation:           - Patient has a contact number available for                            emergencies. The signs and symptoms of potential                            delayed complications were discussed with the                            patient. Return to normal activities tomorrow.                            Written discharge instructions were provided to the                            patient.                           - Resume  previous diet.                           - Continue present medications.                           - No repeat colonoscopy due to current age (67                            years or older) and the absence of colonic polyps. Iva Boop, MD 09/21/2018 1:31:54 PM This report has been signed electronically.

## 2018-09-21 NOTE — Progress Notes (Signed)
To recovery, report to RN, VSS. 

## 2018-09-22 ENCOUNTER — Telehealth: Payer: Self-pay | Admitting: *Deleted

## 2018-09-22 NOTE — Telephone Encounter (Signed)
Left message on f/u call 

## 2018-09-22 NOTE — Telephone Encounter (Signed)
  Follow up Call-  Call back number 09/21/2018  Post procedure Call Back phone  # [phone]  Permission to leave phone message Yes  Some recent data might be hidden     Patient questions:  Do you have a fever, pain , or abdominal swelling? No. Pain Score  0 *  Have you tolerated food without any problems? Yes.    Have you been able to return to your normal activities? Yes.    Do you have any questions about your discharge instructions: Diet   No. Medications  No. Follow up visit  No.  Do you have questions or concerns about your Care? No.  Actions: * If pain score is 4 or above: No action needed, pain <4.

## 2018-12-10 DIAGNOSIS — N529 Male erectile dysfunction, unspecified: Secondary | ICD-10-CM | POA: Diagnosis not present

## 2018-12-10 DIAGNOSIS — E1169 Type 2 diabetes mellitus with other specified complication: Secondary | ICD-10-CM | POA: Diagnosis not present

## 2018-12-10 DIAGNOSIS — E559 Vitamin D deficiency, unspecified: Secondary | ICD-10-CM | POA: Diagnosis not present

## 2018-12-10 DIAGNOSIS — I10 Essential (primary) hypertension: Secondary | ICD-10-CM | POA: Diagnosis not present

## 2018-12-10 DIAGNOSIS — M85851 Other specified disorders of bone density and structure, right thigh: Secondary | ICD-10-CM | POA: Diagnosis not present

## 2018-12-10 DIAGNOSIS — E782 Mixed hyperlipidemia: Secondary | ICD-10-CM | POA: Diagnosis not present

## 2019-07-21 DIAGNOSIS — E559 Vitamin D deficiency, unspecified: Secondary | ICD-10-CM | POA: Diagnosis not present

## 2019-07-21 DIAGNOSIS — E1169 Type 2 diabetes mellitus with other specified complication: Secondary | ICD-10-CM | POA: Diagnosis not present

## 2019-07-21 DIAGNOSIS — E782 Mixed hyperlipidemia: Secondary | ICD-10-CM | POA: Diagnosis not present

## 2019-08-05 DIAGNOSIS — S0502XA Injury of conjunctiva and corneal abrasion without foreign body, left eye, initial encounter: Secondary | ICD-10-CM | POA: Diagnosis not present

## 2019-11-14 DIAGNOSIS — Z20822 Contact with and (suspected) exposure to covid-19: Secondary | ICD-10-CM | POA: Diagnosis not present

## 2019-12-05 DIAGNOSIS — Z23 Encounter for immunization: Secondary | ICD-10-CM | POA: Diagnosis not present

## 2019-12-21 DIAGNOSIS — N529 Male erectile dysfunction, unspecified: Secondary | ICD-10-CM | POA: Diagnosis not present

## 2019-12-21 DIAGNOSIS — M85851 Other specified disorders of bone density and structure, right thigh: Secondary | ICD-10-CM | POA: Diagnosis not present

## 2019-12-21 DIAGNOSIS — E1169 Type 2 diabetes mellitus with other specified complication: Secondary | ICD-10-CM | POA: Diagnosis not present

## 2019-12-21 DIAGNOSIS — Z1389 Encounter for screening for other disorder: Secondary | ICD-10-CM | POA: Diagnosis not present

## 2019-12-21 DIAGNOSIS — Z0000 Encounter for general adult medical examination without abnormal findings: Secondary | ICD-10-CM | POA: Diagnosis not present

## 2019-12-21 DIAGNOSIS — I10 Essential (primary) hypertension: Secondary | ICD-10-CM | POA: Diagnosis not present

## 2019-12-21 DIAGNOSIS — E782 Mixed hyperlipidemia: Secondary | ICD-10-CM | POA: Diagnosis not present

## 2019-12-21 DIAGNOSIS — Z1211 Encounter for screening for malignant neoplasm of colon: Secondary | ICD-10-CM | POA: Diagnosis not present

## 2019-12-21 DIAGNOSIS — E559 Vitamin D deficiency, unspecified: Secondary | ICD-10-CM | POA: Diagnosis not present

## 2019-12-21 DIAGNOSIS — Z125 Encounter for screening for malignant neoplasm of prostate: Secondary | ICD-10-CM | POA: Diagnosis not present

## 2020-01-03 DIAGNOSIS — Z23 Encounter for immunization: Secondary | ICD-10-CM | POA: Diagnosis not present

## 2020-05-17 DIAGNOSIS — R1033 Periumbilical pain: Secondary | ICD-10-CM | POA: Diagnosis not present

## 2020-05-18 ENCOUNTER — Other Ambulatory Visit: Payer: Self-pay | Admitting: Family Medicine

## 2020-05-18 DIAGNOSIS — R1033 Periumbilical pain: Secondary | ICD-10-CM

## 2020-05-22 ENCOUNTER — Other Ambulatory Visit: Payer: Self-pay | Admitting: Family Medicine

## 2020-05-22 ENCOUNTER — Ambulatory Visit
Admission: RE | Admit: 2020-05-22 | Discharge: 2020-05-22 | Disposition: A | Discharge status: Home / Self Care | Payer: 59 | Source: Ambulatory Visit | Attending: Family Medicine | Admitting: Family Medicine

## 2020-05-22 DIAGNOSIS — R1033 Periumbilical pain: Secondary | ICD-10-CM

## 2020-06-17 DIAGNOSIS — B349 Viral infection, unspecified: Secondary | ICD-10-CM | POA: Diagnosis not present

## 2020-06-17 DIAGNOSIS — R05 Cough: Secondary | ICD-10-CM | POA: Diagnosis not present

## 2020-06-17 DIAGNOSIS — B974 Respiratory syncytial virus as the cause of diseases classified elsewhere: Secondary | ICD-10-CM | POA: Diagnosis not present

## 2020-06-17 DIAGNOSIS — R0981 Nasal congestion: Secondary | ICD-10-CM | POA: Diagnosis not present

## 2020-06-23 DIAGNOSIS — N529 Male erectile dysfunction, unspecified: Secondary | ICD-10-CM | POA: Diagnosis not present

## 2020-06-23 DIAGNOSIS — E559 Vitamin D deficiency, unspecified: Secondary | ICD-10-CM | POA: Diagnosis not present

## 2020-06-23 DIAGNOSIS — Z23 Encounter for immunization: Secondary | ICD-10-CM | POA: Diagnosis not present

## 2020-06-23 DIAGNOSIS — I10 Essential (primary) hypertension: Secondary | ICD-10-CM | POA: Diagnosis not present

## 2020-06-23 DIAGNOSIS — R109 Unspecified abdominal pain: Secondary | ICD-10-CM | POA: Diagnosis not present

## 2020-06-23 DIAGNOSIS — E1169 Type 2 diabetes mellitus with other specified complication: Secondary | ICD-10-CM | POA: Diagnosis not present

## 2020-06-23 DIAGNOSIS — M85851 Other specified disorders of bone density and structure, right thigh: Secondary | ICD-10-CM | POA: Diagnosis not present

## 2020-06-23 DIAGNOSIS — E782 Mixed hyperlipidemia: Secondary | ICD-10-CM | POA: Diagnosis not present

## 2020-06-28 ENCOUNTER — Other Ambulatory Visit: Payer: Self-pay | Admitting: Family Medicine

## 2020-06-28 DIAGNOSIS — M8580 Other specified disorders of bone density and structure, unspecified site: Secondary | ICD-10-CM

## 2020-08-07 ENCOUNTER — Encounter: Payer: Self-pay | Admitting: Internal Medicine

## 2020-08-07 ENCOUNTER — Other Ambulatory Visit (INDEPENDENT_AMBULATORY_CARE_PROVIDER_SITE_OTHER): Payer: 59

## 2020-08-07 ENCOUNTER — Ambulatory Visit (INDEPENDENT_AMBULATORY_CARE_PROVIDER_SITE_OTHER): Payer: 59 | Admitting: Internal Medicine

## 2020-08-07 VITALS — BP 114/64 | HR 79 | Ht 66.0 in | Wt 167.2 lb

## 2020-08-07 DIAGNOSIS — R1032 Left lower quadrant pain: Secondary | ICD-10-CM | POA: Diagnosis not present

## 2020-08-07 DIAGNOSIS — R10814 Left lower quadrant abdominal tenderness: Secondary | ICD-10-CM

## 2020-08-07 LAB — CREATININE, SERUM: Creatinine, Ser: 0.88 mg/dL (ref 0.40–1.50)

## 2020-08-07 LAB — BUN: BUN: 14 mg/dL (ref 6–23)

## 2020-08-07 NOTE — Patient Instructions (Signed)
Your provider has requested that you go to the basement level for lab work before leaving today. Press "B" on the elevator. The lab is located at the first door on the left as you exit the elevator.   You have been scheduled for a CT scan of the abdomen and pelvis at [facility]. [place].  [phone].  You are scheduled on November 1st at 4:00pm. You should arrive 15 minutes prior to your appointment time for registration. Please follow the written instructions below on the day of your exam:  WARNING: IF YOU ARE ALLERGIC TO IODINE/X-RAY DYE, PLEASE NOTIFY RADIOLOGY IMMEDIATELY AT [phone]! YOU WILL BE GIVEN A 13 HOUR PREMEDICATION PREP.  1) Do not eat or drink anything after 1:00pm (4 hours prior to your test)  Drink 1 bottle of contrast @ 2:00pm (2 hours prior to your exam)  Drink 1 bottle of contrast @ 3:00pm (1 hour prior to your exam)  You may take any medications as prescribed with a small amount of water, if necessary. If you take any of the following medications: METFORMIN, GLUCOPHAGE, GLUCOVANCE, AVANDAMET, RIOMET, FORTAMET, [place] MET, JANUMET, GLUMETZA or METAGLIP, you MAY be asked to HOLD this medication 48 hours AFTER the exam.  The purpose of you drinking the oral contrast is to aid in the visualization of your intestinal tract. The contrast solution may cause some diarrhea. Depending on your individual set of symptoms, you may also receive an intravenous injection of x-ray contrast/dye. Plan on being at [place] for 30 minutes or longer, depending on the type of exam you are having performed.  This test typically takes 30-45 minutes to complete.  If you have any questions regarding your exam or if you need to reschedule, you may call the CT department at [phone] between the hours of 8:00 am and 5:00 pm, Monday-Friday.  ________________________________________________________________________  We will call you with results  and plans.   I appreciate the opportunity to care for you. Silvano Rusk, MD, [facility]

## 2020-08-07 NOTE — Progress Notes (Signed)
Samuel Huang 69 y.o. [birth year] [id number]  Assessment & Plan:   Encounter Diagnoses  Name Primary?  Marland Kitchen LLQ pain Yes  . Left lower quadrant abdominal tenderness without rebound tenderness     Orders Placed This Encounter  Procedures  . CT Abdomen Pelvis W Contrast  . BUN  . Creatinine, serum    Seems to me it is unlikely anything bad is going on but he has a new symptom and some tenderness.  The weight loss seems to be intentional.  We'll proceed with a CT as above to evaluate.  If that is negative would observe.  I appreciate the opportunity to care for this patient. CC: Samuel Joe, MD  Subjective:   Chief Complaint: Left lower quadrant pain  HPI 69 year old man known to me from screening colonoscopy in December 2019 with sigmoid diverticulosis, otherwise negative and prior history of gastroenteritis.  He has been intentionally losing weight by reducing carbs and eating more whole foods and fresh vegetables.  He has been noticing a little bit of left flank and left-sided abdominal pain off and on.  2-3 times a week may last for up to a couple of hours there are no clear triggers.  There are no changes with movement bowel movements eating.  Primary care ordered a pelvic ultrasound that was unrevealing.  The patient is concerned what this might represent.  He is not had problems like this before.  He cannot identify clear triggers.  Wt Readings from Last 3 Encounters:  08/07/20 167 lb 3.2 oz (75.8 kg)  09/21/18 179 lb (81.2 kg)  09/08/18 179 lb 9.6 oz (81.5 kg)   He has some heartburn at times treated with over-the-counter acid reliever and no difficulties otherwise. Allergies  Allergen Reactions  . Penicillins     REACTION: rash   Current Meds  Medication Sig  . amLODipine (NORVASC) 2.5 MG tablet Take 2.5 mg by mouth daily.  . Ascorbic Acid (VITAMIN C) 500 MG CHEW Chew 500 mg by mouth daily.  . carvedilol (COREG) 6.25 MG tablet Take 1 tablet by mouth 2 (two)  times daily.  . Cholecalciferol (VITAMIN D) 2000 units CAPS Take 1 capsule by mouth daily.   Marland Kitchen losartan (COZAAR) 100 MG tablet Take 1 tablet by mouth daily.   . sildenafil (REVATIO) 20 MG tablet Take 20 mg by mouth as needed.  . simvastatin (ZOCOR) 20 MG tablet Take 1 tablet by mouth daily.   Past Medical History:  Diagnosis Date  . Anxiety   . BPH (benign prostatic hyperplasia)   . Cataract   . Depression    work related/ retired  . ED (erectile dysfunction)   . GERD (gastroesophageal reflux disease)   . Hemorrhoids   . Hyperlipidemia   . Hypertension   . Insomnia   . Osteopenia   . Vitamin D deficiency    Past Surgical History:  Procedure Laterality Date  . COLONOSCOPY    . FINGER FRACTURE SURGERY Left    Pinky finger    family history includes Diabetes in his father and sister; Heart disease in his mother.   Review of Systems  As per HPI Objective:   Physical Exam BP 114/64   Pulse 79   Ht 5\' 6"  (1.676 m)   Wt 167 lb 3.2 oz (75.8 kg)   SpO2 98%   BMI 26.99 kg/m  Well-developed well-nourished white man in no acute distress Abdomen is soft bowel sounds are present there is no organomegaly mass or hernia.  He does have some tenderness in the left lower quadrant to deep palpation but overall feels relatively benign.

## 2020-08-09 ENCOUNTER — Encounter: Payer: Self-pay | Admitting: Internal Medicine

## 2020-08-21 ENCOUNTER — Ambulatory Visit
Admission: RE | Admit: 2020-08-21 | Discharge: 2020-08-21 | Disposition: A | Discharge status: Home / Self Care | Payer: Medicare Other | Source: Ambulatory Visit | Attending: Internal Medicine | Admitting: Internal Medicine

## 2020-08-21 DIAGNOSIS — R1032 Left lower quadrant pain: Secondary | ICD-10-CM

## 2020-08-21 DIAGNOSIS — R10814 Left lower quadrant abdominal tenderness: Secondary | ICD-10-CM

## 2020-08-21 MED ORDER — IOPAMIDOL (ISOVUE-300) INJECTION 61%
100.0000 mL | Freq: Once | INTRAVENOUS | Status: AC | PRN
  Administered 2020-08-21: 100 mL via INTRAVENOUS

## 2020-09-10 DIAGNOSIS — Z23 Encounter for immunization: Secondary | ICD-10-CM | POA: Diagnosis not present

## 2020-09-25 ENCOUNTER — Ambulatory Visit
Admission: RE | Admit: 2020-09-25 | Discharge: 2020-09-25 | Disposition: A | Discharge status: Home / Self Care | Payer: Medicare Other | Source: Ambulatory Visit | Attending: Family Medicine | Admitting: Family Medicine

## 2020-09-25 ENCOUNTER — Other Ambulatory Visit: Payer: Self-pay

## 2020-09-25 DIAGNOSIS — M8580 Other specified disorders of bone density and structure, unspecified site: Secondary | ICD-10-CM

## 2021-02-27 DIAGNOSIS — L237 Allergic contact dermatitis due to plants, except food: Secondary | ICD-10-CM | POA: Diagnosis not present

## 2021-10-17 IMAGING — US US PELVIS LIMITED
1 series · 14 of 20 positions shown · non-contrast
Comparison: CT abdomen and pelvis February 08, 2004

CLINICAL DATA: Reported left lower quadrant pain

EXAM:
ULTRASOUND LEFT LOWER QUADRANT
TECHNIQUE: Longitudinal and transverse images of the left lower quadrant
obtained with and without Valsalva maneuvers.

[Series 1: us pelvis limited · 0.08mm/px · 20 acquisitions, 14 frames shown]
[im 1/20]
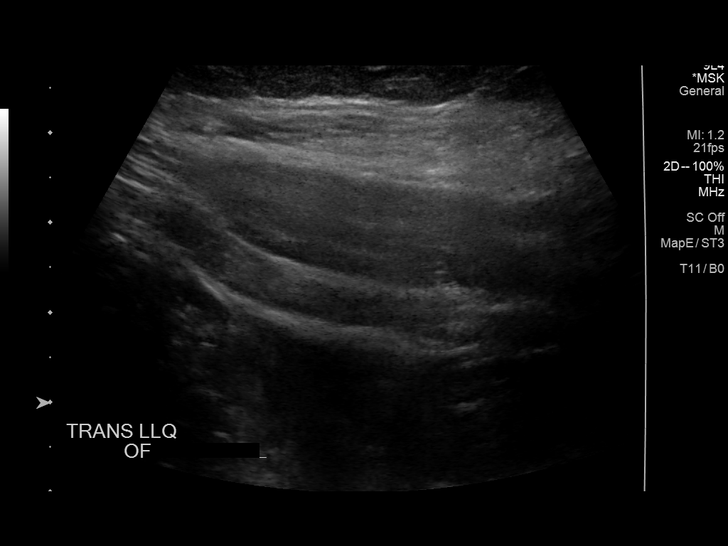
[im 3/20]
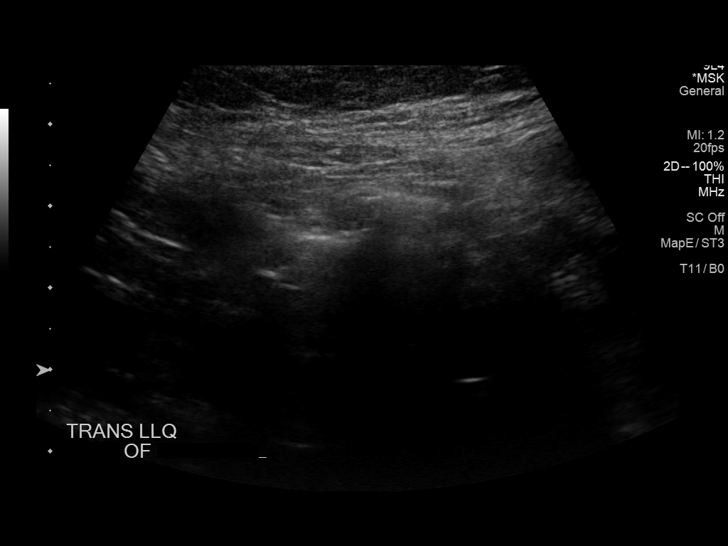
[im 4/20]
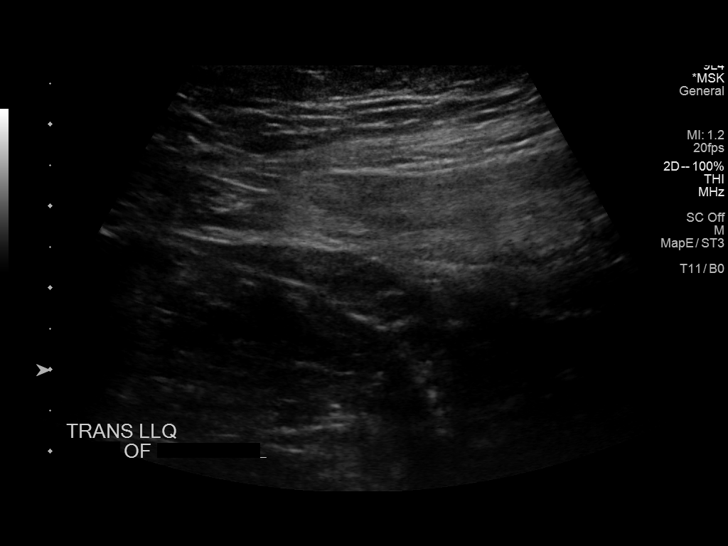
[im 6/20]
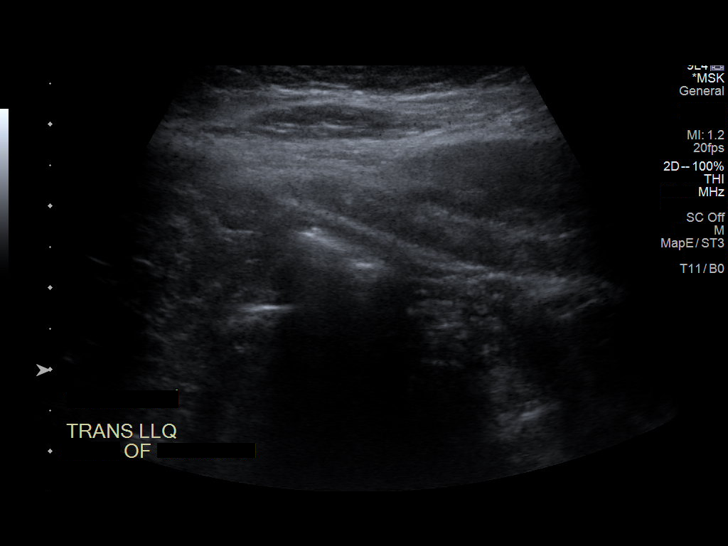
[im 7/20]
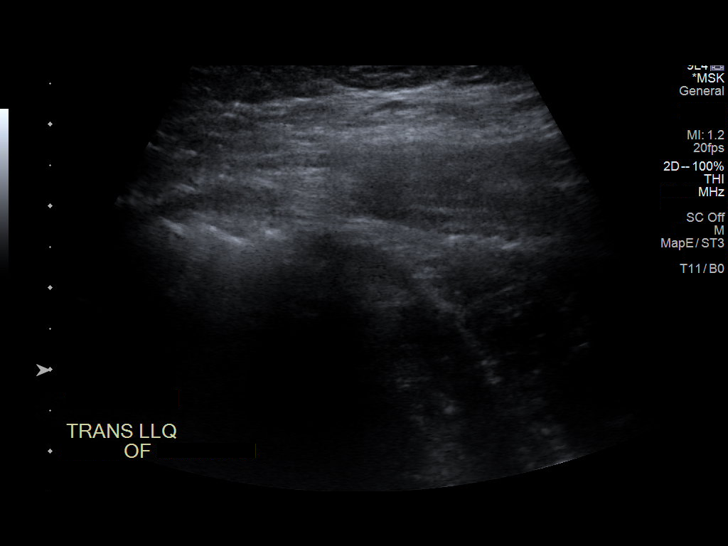
[im 8/20]
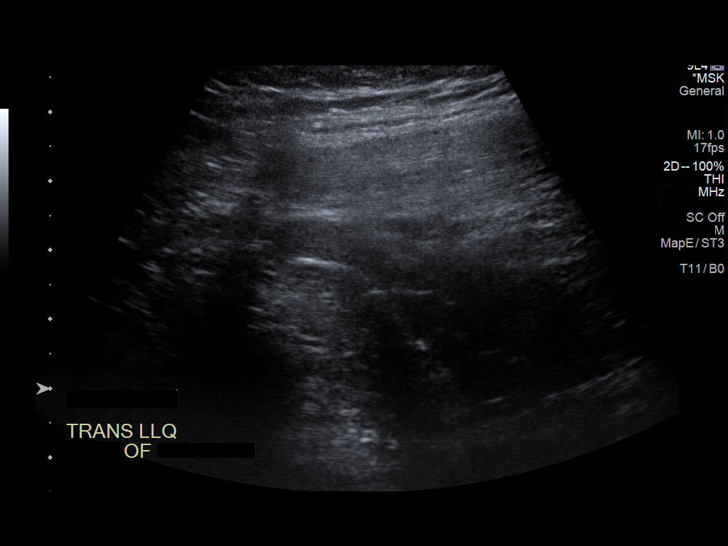
[im 10/20]
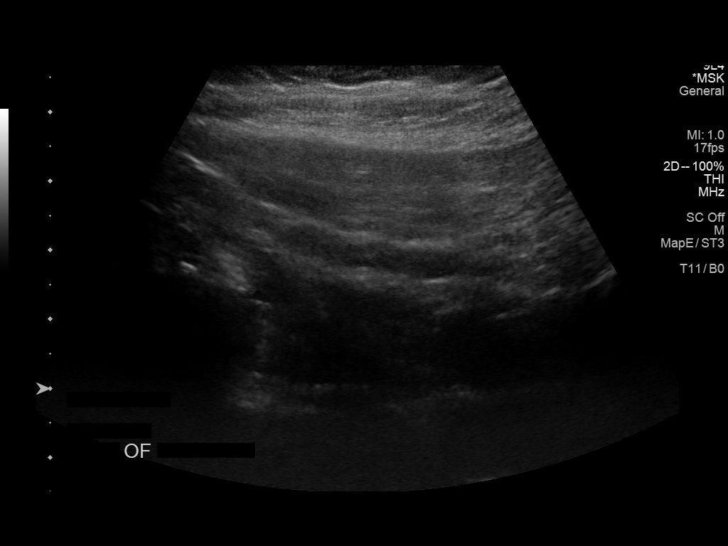
[im 11/20]
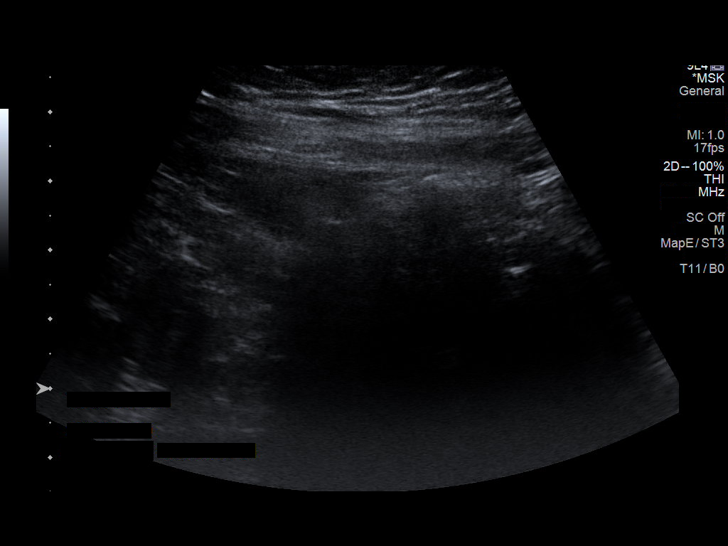
[im 13/20]
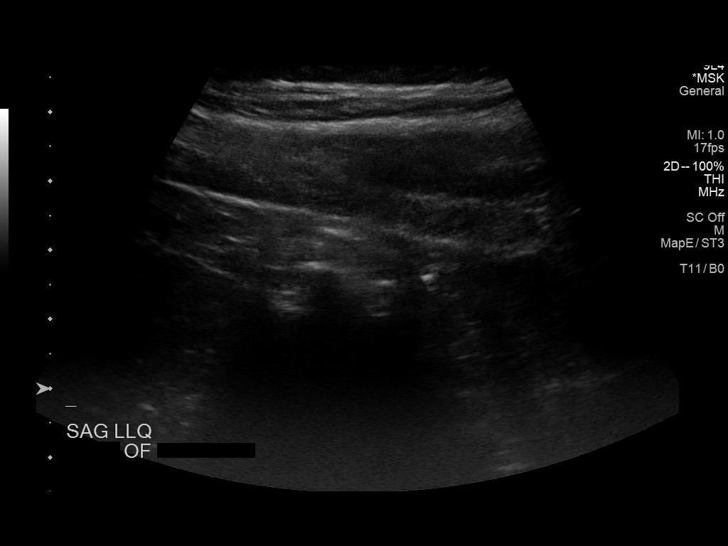
[im 14/20]
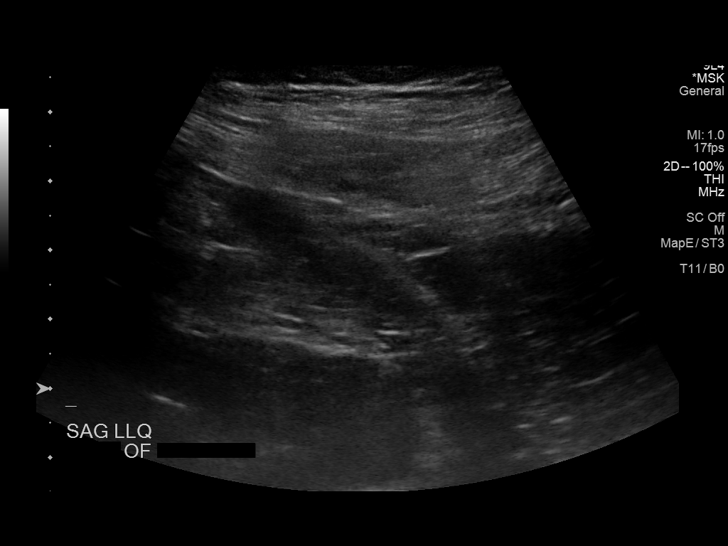
[im 16/20]
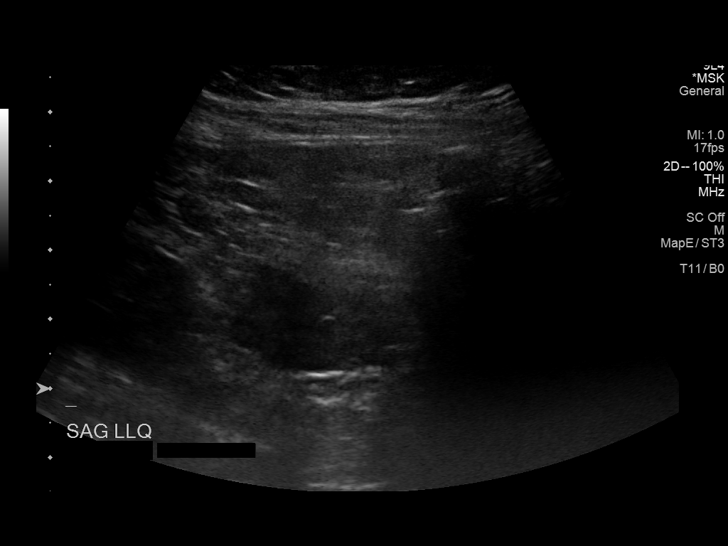
[im 17/20]
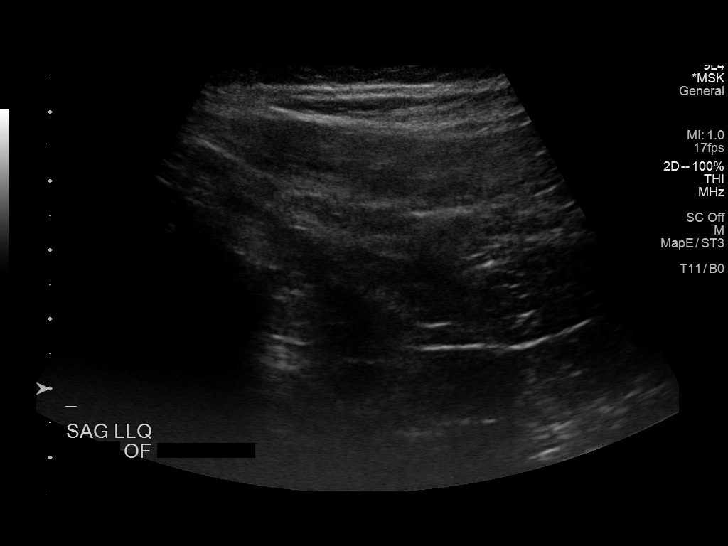
[im 18/20]
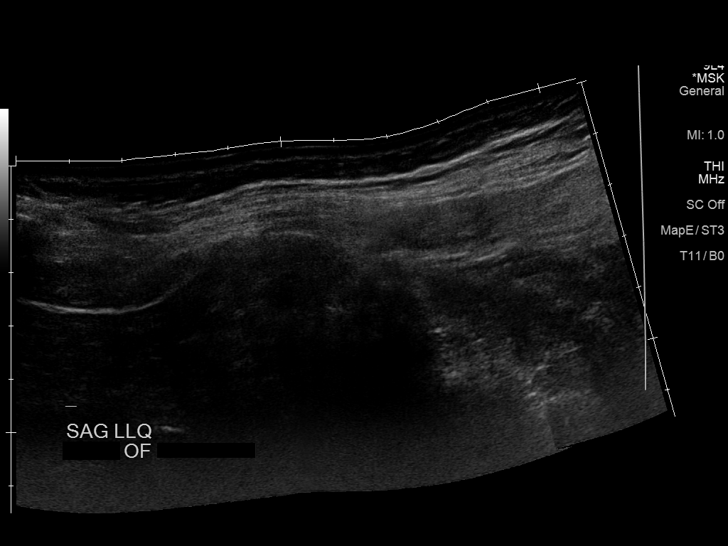
[im 20/20]
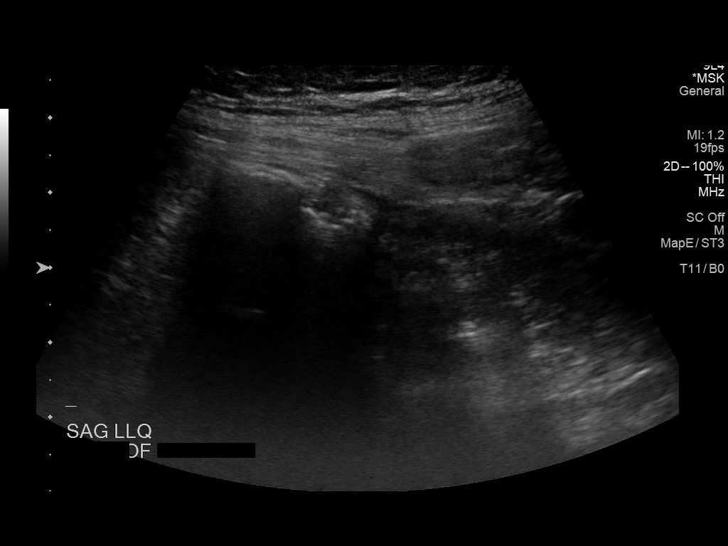

[14 of 20 positions shown; findings below may reference images not displayed]

FINDINGS: No lesion is seen by ultrasound in the left lower quadrant region.
In particular, no mass or hernia. No abnormal fluid or adenopathy
appreciable by ultrasound. Peristalsing bowel seen in this area.
IMPRESSION: No abnormality appreciable by ultrasound in the left lower quadrant
region. If there remains concern for pathology in this area, would
advise CT through this area, ideally with oral and intravenous
contrast.

## 2022-01-16 IMAGING — CT CT ABD-PELV W/ CM
2 of 5 series · 13 of 46 positions shown, 15 images · IV contrast (iopamidol)
Comparison: Prior MRI from 0115 and previous pelvis sonogram from
May 22, 2020

CLINICAL DATA: LEFT lower quadrant abdominal pain, history of
hernia repair.

EXAM:
CT ABDOMEN AND PELVIS WITH CONTRAST
TECHNIQUE: Multidetector CT imaging of the abdomen and pelvis was performed
using the standard protocol following bolus administration of
intravenous contrast.
CONTRAST:  100mL FCMN8P-NRR IOPAMIDOL (FCMN8P-NRR) INJECTION 61%

[Series 2: abd pelvis 5.00 br40 s3 axial · axial · 0.59mm/px · z∈[+1052,+1442]mm · 10 of 88 slices shown, 12 images]
[im 5/88  soft-tissue]
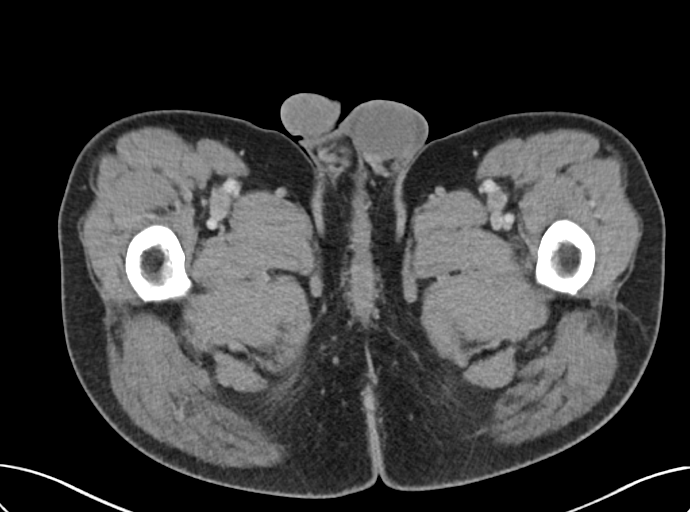
[im 5/88  bone]
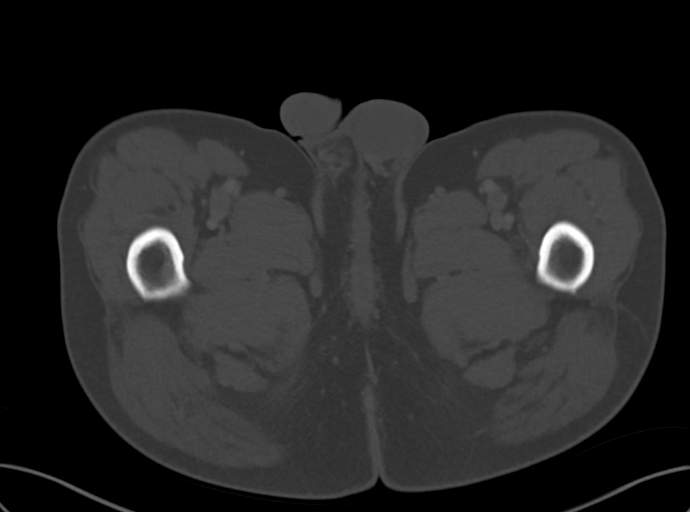
[im 15/88  soft-tissue]
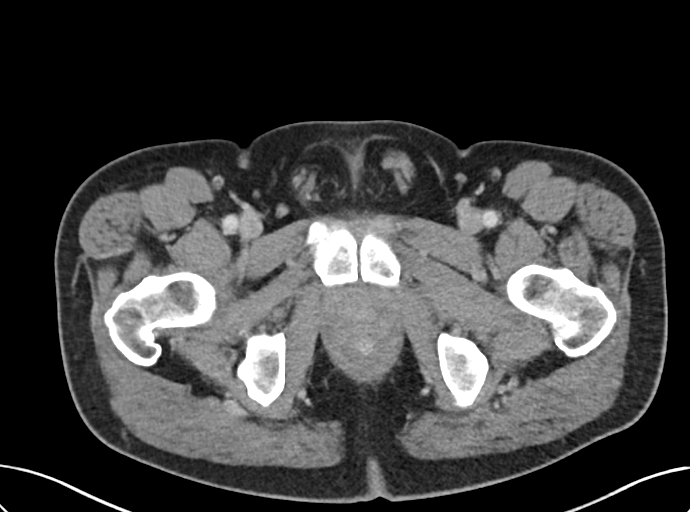
[im 25/88  soft-tissue]
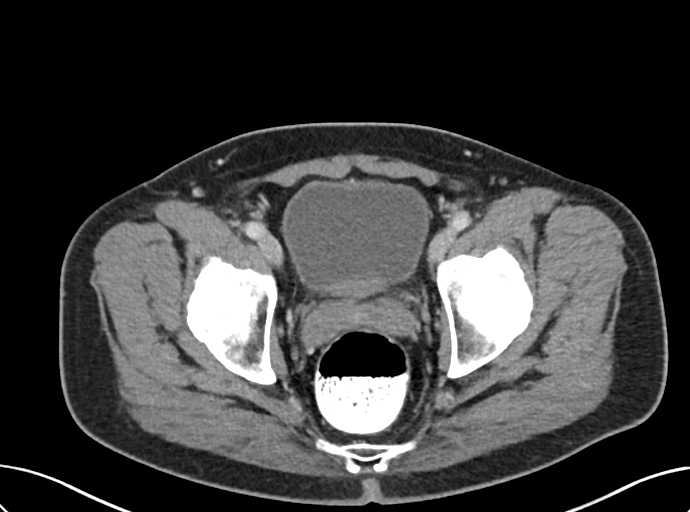
[im 30/88  soft-tissue]
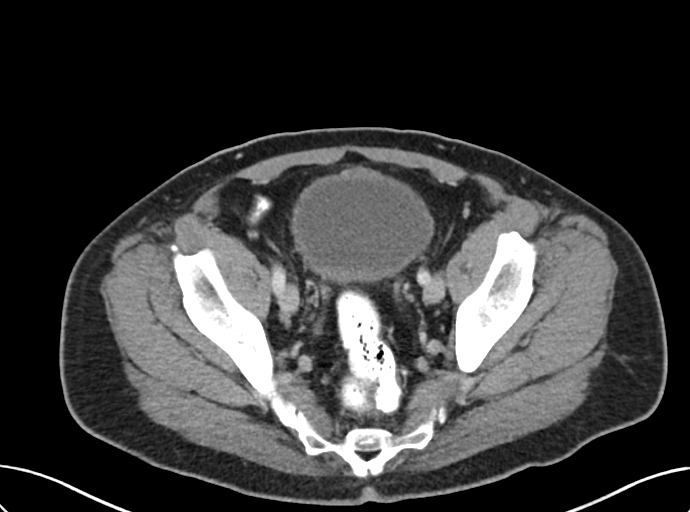
[im 39/88  soft-tissue]
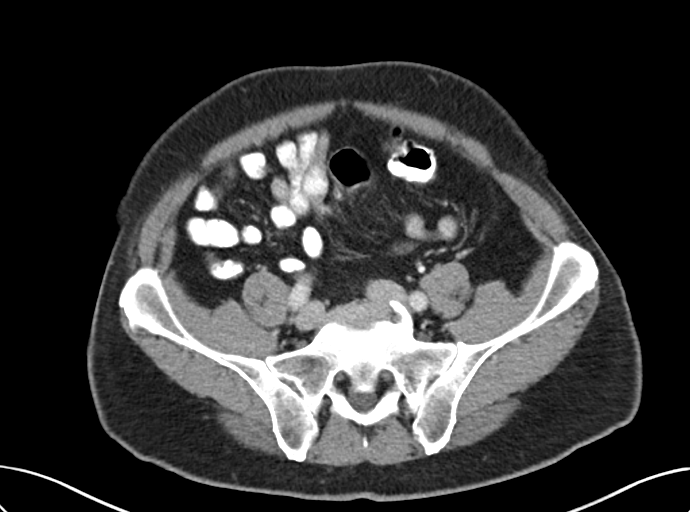
[im 49/88  soft-tissue]
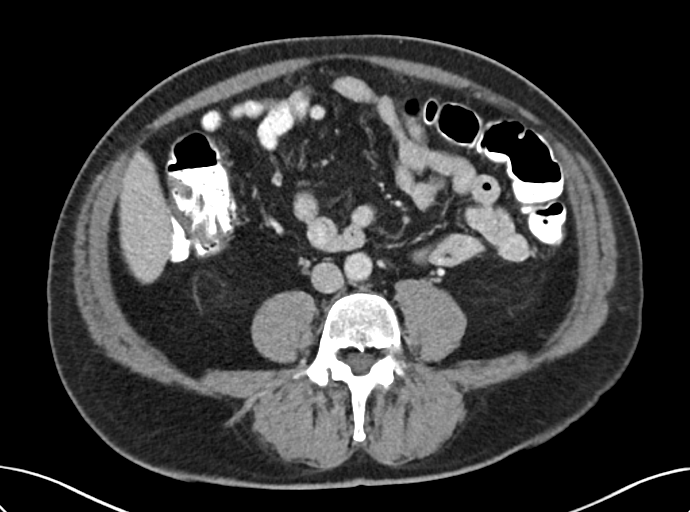
[im 59/88  soft-tissue]
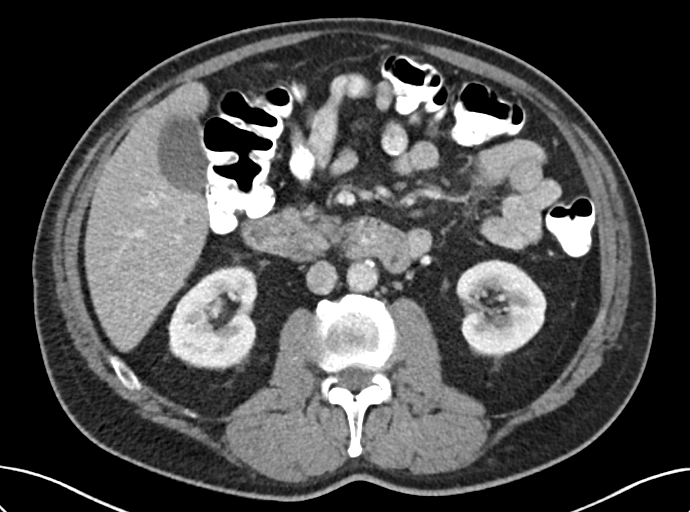
[im 63/88  soft-tissue]
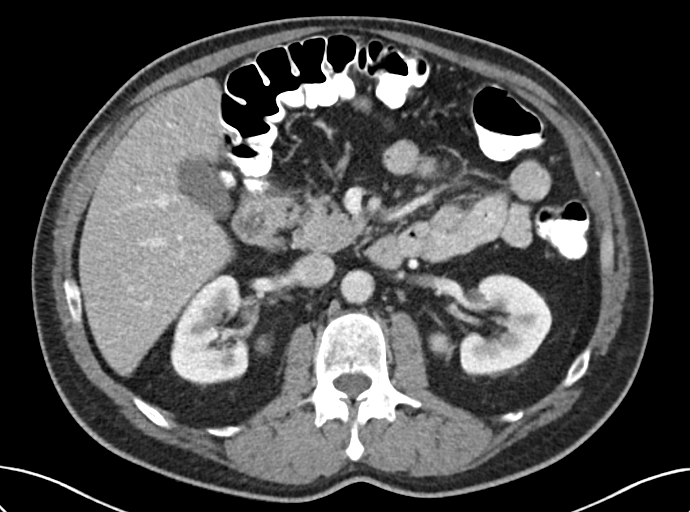
[im 73/88  soft-tissue]
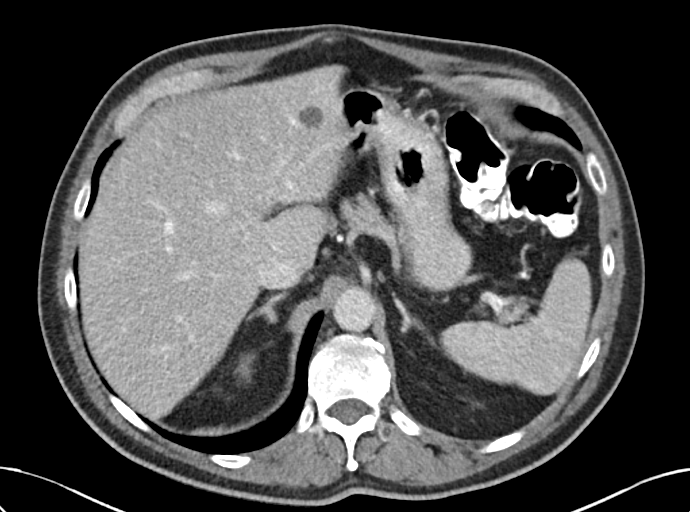
[im 73/88  bone]
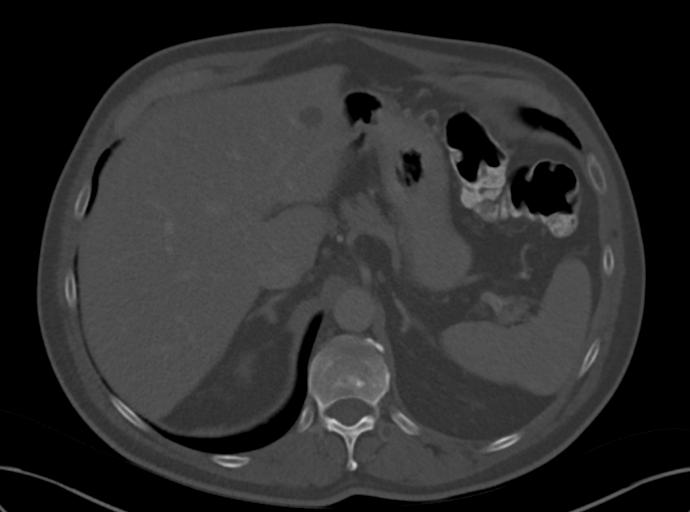
[im 83/88  soft-tissue]
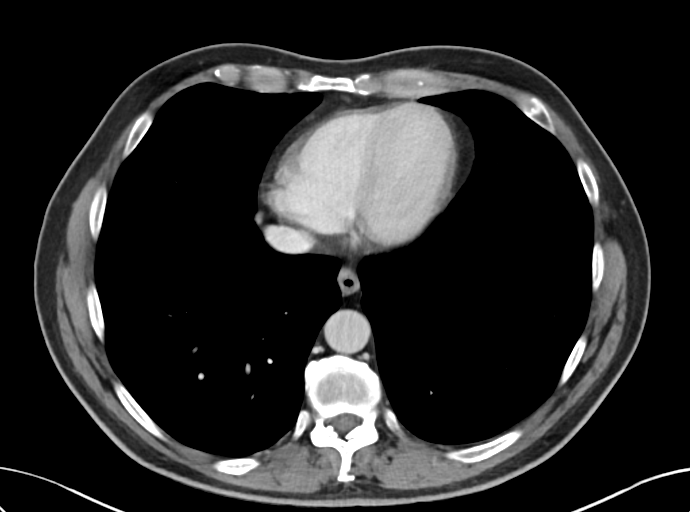

[Series 6: abd pelvis 2.00 br40 s3 cor · coronal · 0.74mm/px · 3 of 146 slices shown]
[im 49/146  soft-tissue]
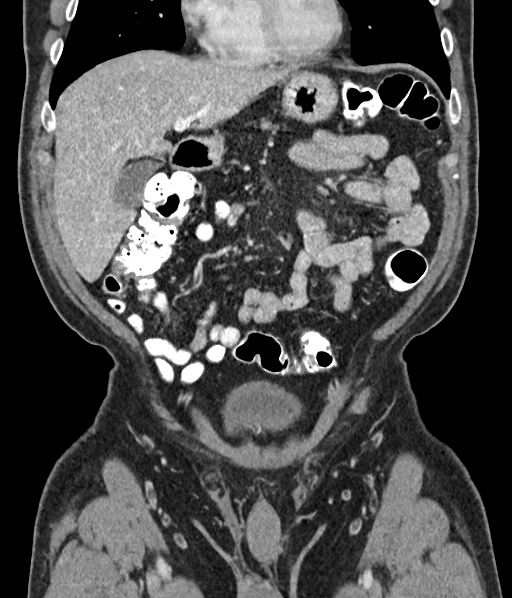
[im 65/146  soft-tissue]
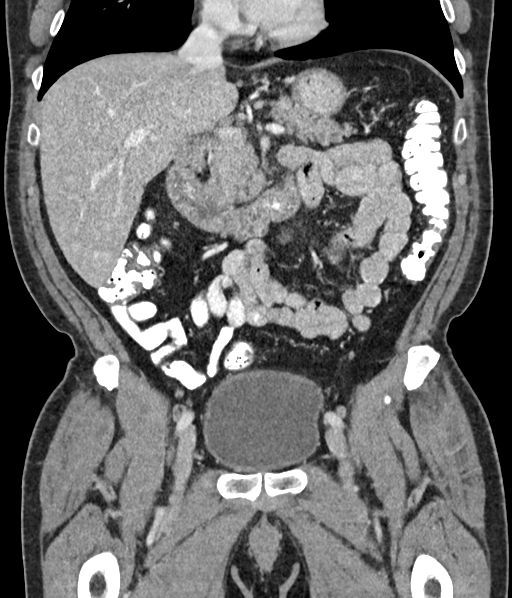
[im 81/146  soft-tissue]
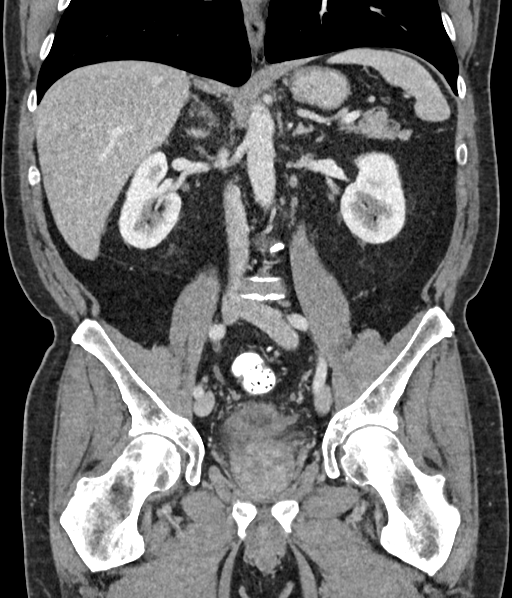

[13 of 46 positions shown; findings below may reference images not displayed]

FINDINGS: Lower chest: Lung bases are clear.

Hepatobiliary: Hepatic steatosis with small LEFT hepatic lobe cyst.
Portal vein is patent. No pericholecystic stranding. No biliary duct
dilation.

Pancreas: Normal

Spleen: Normal

Adrenals/Urinary Tract: Adrenal glands are normal.

Urinary bladder unremarkable.  No hydronephrosis.

Stomach/Bowel: No acute gastrointestinal process normal appendix.
Ingested contrast material fills much of the colon.

Vascular/Lymphatic: Separate origin of splenic and hepatic arteries.
1.6 cm periportal lymph node, short axis measurement. Compared to
previous MR imaging from 0115 this is within 1-2 mm of its previous
size. No retroperitoneal adenopathy. No additional nodal enlargement
No pelvic sidewall lymphadenopathy.

Reproductive: Heterogeneous prostate, nonspecific finding on CT.

Other: No ascites.

Musculoskeletal: No acute bone finding. No destructive bone process.
IMPRESSION: 1. No acute gastrointestinal process.
2. Hepatic steatosis.
3. Mildly enlarged periportal lymph nodes, correlate with any
clinical or laboratory evidence of liver disease in the setting of
presumed steatosis. This lymph node is within 1-2 mm of its size in

## 2022-02-22 DIAGNOSIS — J309 Allergic rhinitis, unspecified: Secondary | ICD-10-CM | POA: Diagnosis not present

## 2022-02-22 DIAGNOSIS — I10 Essential (primary) hypertension: Secondary | ICD-10-CM | POA: Diagnosis not present

## 2022-02-22 DIAGNOSIS — M85851 Other specified disorders of bone density and structure, right thigh: Secondary | ICD-10-CM | POA: Diagnosis not present

## 2022-02-22 DIAGNOSIS — N529 Male erectile dysfunction, unspecified: Secondary | ICD-10-CM | POA: Diagnosis not present

## 2022-02-22 DIAGNOSIS — Z125 Encounter for screening for malignant neoplasm of prostate: Secondary | ICD-10-CM | POA: Diagnosis not present

## 2022-02-22 DIAGNOSIS — D72829 Elevated white blood cell count, unspecified: Secondary | ICD-10-CM | POA: Diagnosis not present

## 2022-02-22 DIAGNOSIS — E1169 Type 2 diabetes mellitus with other specified complication: Secondary | ICD-10-CM | POA: Diagnosis not present

## 2022-02-22 DIAGNOSIS — E782 Mixed hyperlipidemia: Secondary | ICD-10-CM | POA: Diagnosis not present

## 2022-02-25 ENCOUNTER — Telehealth: Payer: Self-pay | Admitting: Internal Medicine

## 2022-02-25 NOTE — Telephone Encounter (Signed)
Scheduled appt per 5/8 referral. Pt is aware of appt date and time. Pt is aware to arrive 15 mins prior to appt time and to bring and updated insurance card. Pt is aware of appt location.   ?

## 2022-03-08 ENCOUNTER — Other Ambulatory Visit: Payer: Self-pay

## 2022-03-08 DIAGNOSIS — R059 Cough, unspecified: Secondary | ICD-10-CM

## 2022-03-11 ENCOUNTER — Inpatient Hospital Stay: Payer: No Typology Code available for payment source | Attending: Internal Medicine | Admitting: Internal Medicine

## 2022-03-11 ENCOUNTER — Other Ambulatory Visit: Payer: Self-pay

## 2022-03-11 ENCOUNTER — Telehealth: Payer: Self-pay | Admitting: Internal Medicine

## 2022-03-11 ENCOUNTER — Inpatient Hospital Stay: Payer: No Typology Code available for payment source

## 2022-03-11 VITALS — BP 152/80 | HR 75 | Temp 97.5°F | Resp 17 | Wt 170.4 lb

## 2022-03-11 DIAGNOSIS — D72820 Lymphocytosis (symptomatic): Secondary | ICD-10-CM | POA: Diagnosis not present

## 2022-03-11 DIAGNOSIS — R059 Cough, unspecified: Secondary | ICD-10-CM

## 2022-03-11 LAB — CMP (CANCER CENTER ONLY)
ALT: 26 U/L (ref 0–44)
AST: 28 U/L (ref 15–41)
Albumin: 4.4 g/dL (ref 3.5–5.0)
Alkaline Phosphatase: 49 U/L (ref 38–126)
Anion gap: 3 — ABNORMAL LOW (ref 5–15)
BUN: 16 mg/dL (ref 8–23)
CO2: 31 mmol/L (ref 22–32)
Calcium: 9.9 mg/dL (ref 8.9–10.3)
Chloride: 103 mmol/L (ref 98–111)
Creatinine: 1.09 mg/dL (ref 0.61–1.24)
GFR, Estimated: >60 mL/min (ref >60)
Glucose, Bld: 146 mg/dL — ABNORMAL HIGH (ref 70–99)
Potassium: 4.7 mmol/L (ref 3.5–5.1)
Sodium: 137 mmol/L (ref 135–145)
Total Bilirubin: 0.5 mg/dL (ref 0.3–1.2)
Total Protein: 7.7 g/dL (ref 6.5–8.1)

## 2022-03-11 LAB — CBC WITH DIFFERENTIAL (CANCER CENTER ONLY)
Abs Immature Granulocytes: 0.02 10*3/uL (ref 0.00–0.07)
Basophils Absolute: 0.1 10*3/uL (ref 0.0–0.1)
Basophils Relative: 1 %
Eosinophils Absolute: 0.2 10*3/uL (ref 0.0–0.5)
Eosinophils Relative: 1 %
HCT: 45 % (ref 39.0–52.0)
Hemoglobin: 15.3 g/dL (ref 13.0–17.0)
Immature Granulocytes: 0 %
Lymphocytes Relative: 50 %
Lymphs Abs: 5.2 10*3/uL — ABNORMAL HIGH (ref 0.7–4.0)
MCH: 32.1 pg (ref 26.0–34.0)
MCHC: 34 g/dL (ref 30.0–36.0)
MCV: 94.3 fL (ref 80.0–100.0)
Monocytes Absolute: 0.7 10*3/uL (ref 0.1–1.0)
Monocytes Relative: 7 %
Neutro Abs: 4.2 10*3/uL (ref 1.7–7.7)
Neutrophils Relative %: 41 %
Platelet Count: 249 10*3/uL (ref 150–400)
RBC: 4.77 MIL/uL (ref 4.22–5.81)
RDW: 12.3 % (ref 11.5–15.5)
Smear Review: NORMAL
WBC Count: 10.4 10*3/uL (ref 4.0–10.5)
nRBC: 0 % (ref 0.0–0.2)

## 2022-03-11 NOTE — Progress Notes (Signed)
[facility] Telephone:([phone]) [phone]   Fax:(336) [phone]  CONSULT NOTE  REFERRING PHYSICIAN: Dr. [name]  REASON FOR CONSULTATION:  71 years old white male for evaluation of lymphocytosis  HPI Samuel Huang is a 70 y.o. male with past medical history significant for borderline diabetes mellitus, hypertension, dyslipidemia, osteoporosis, GERD as well as erectile dysfunction.  The patient was seen recently by his primary care provider Dr. Bess Harvest for routine evaluation and during his blood work he was noted to have slightly elevated leukocyte count with lymphocytosis.  His absolute lymphocyte count was around 6500.  He has normal hemoglobin of 15.1 hematocrit 44.9% and platelet count of 2 46,000 repeat blood work showed similar results.  The patient was referred to me today for evaluation and recommendation regarding his condition. When seen today the patient is feeling fine today with no concerning complaints.  He intentionally trying to lose some weight.  He denied having any bleeding, bruises or ecchymosis.  He has no chest pain, shortness of breath, cough or hemoptysis.  He has no nausea, vomiting, diarrhea or constipation.  He has no headache or visual changes. Family history significant for father with end-stage renal disease.  Mother had heart disease.  Older sister had kidney cancer. The patient is married and has 2 sons.  He is currently retired and used to work for [employer].  The patient has a history for smoking less than 1 pack/day for around 10 years but quit 4 years ago.  He drinks alcohol socially and no history of drug abuse.  HPI  Past Medical History:  Diagnosis Date   Anxiety    BPH (benign prostatic hyperplasia)    Cataract    Depression    work related/ retired   ED (erectile dysfunction)    GERD (gastroesophageal reflux disease)    Hemorrhoids    Hyperlipidemia    Hypertension    Insomnia    Osteopenia    Vitamin D  deficiency     Past Surgical History:  Procedure Laterality Date   COLONOSCOPY     FINGER FRACTURE SURGERY Left    Pinky finger    Family History  Problem Relation Age of Onset   Heart disease Mother    Diabetes Father    Diabetes Sister    Colon cancer Neg Hx    Stomach cancer Neg Hx    Rectal cancer Neg Hx    Esophageal cancer Neg Hx    Liver cancer Neg Hx     Social History Social History   Tobacco Use   Smoking status: Former    Packs/day: 0.30    Years: 3.00    Pack years: 0.90    Types: Cigarettes    Quit date: 10/21/1978    Years since quitting: 43.4   Smokeless tobacco: Never  Vaping Use   Vaping Use: Never used  Substance Use Topics   Alcohol use: Yes    Comment: occ   Drug use: No    Allergies  Allergen Reactions   Penicillins     REACTION: rash    Current Outpatient Medications  Medication Sig Dispense Refill   amLODipine (NORVASC) 2.5 MG tablet Take 2.5 mg by mouth daily.     Ascorbic Acid (VITAMIN C) 500 MG CHEW Chew 500 mg by mouth daily.     carvedilol (COREG) 6.25 MG tablet Take 1 tablet by mouth 2 (two) times daily.     Cholecalciferol (VITAMIN D) 2000 units  CAPS Take 1 capsule by mouth daily.      losartan (COZAAR) 100 MG tablet Take 1 tablet by mouth daily.      sildenafil (REVATIO) 20 MG tablet Take 20 mg by mouth as needed.     simvastatin (ZOCOR) 20 MG tablet Take 1 tablet by mouth daily.     No current facility-administered medications for this visit.    Review of Systems  Constitutional: negative Eyes: negative Ears, nose, mouth, throat, and face: negative Respiratory: negative Cardiovascular: negative Gastrointestinal: negative Genitourinary:negative Integument/breast: negative Hematologic/lymphatic: negative Musculoskeletal:negative Neurological: negative Behavioral/Psych: negative Endocrine: negative Allergic/Immunologic: negative  Physical Exam  Samuel Huang, healthy, no distress, well nourished, and well  developed SKIN: skin color, texture, turgor are normal, no rashes or significant lesions HEAD: Normocephalic, No masses, lesions, tenderness or abnormalities EYES: normal, PERRLA, Conjunctiva are pink and non-injected EARS: External ears normal, Canals clear OROPHARYNX:no exudate, no erythema, and lips, buccal mucosa, and tongue normal  NECK: supple, no adenopathy, no JVD LYMPH:  no palpable lymphadenopathy, no hepatosplenomegaly LUNGS: clear to auscultation , and palpation HEART: regular rate & rhythm, no murmurs, and no gallops ABDOMEN:abdomen soft, non-tender, normal bowel sounds, and no masses or organomegaly BACK: Back symmetric, no curvature., No CVA tenderness EXTREMITIES:no joint deformities, effusion, or inflammation, no edema  NEURO: alert & oriented x 3 with fluent speech, no focal motor/sensory deficits  PERFORMANCE STATUS: ECOG 0  LABORATORY DATA: Lab Results  Component Value Date   WBC 10.4 03/11/2022   HGB 15.3 03/11/2022   HCT 45.0 03/11/2022   MCV 94.3 03/11/2022   PLT 249 03/11/2022      Chemistry      Component Value Date/Time   NA 137 03/11/2022 1122   K 4.7 03/11/2022 1122   CL 103 03/11/2022 1122   CO2 31 03/11/2022 1122   BUN 16 03/11/2022 1122   CREATININE 1.09 03/11/2022 1122      Component Value Date/Time   CALCIUM 9.9 03/11/2022 1122   ALKPHOS 49 03/11/2022 1122   AST 28 03/11/2022 1122   ALT 26 03/11/2022 1122   BILITOT 0.5 03/11/2022 1122       RADIOGRAPHIC STUDIES: No results found.  ASSESSMENT: This is a very pleasant 71 years old white male presented for evaluation of lymphocytosis likely a manifestation of chronic lymphocytic leukemia.   PLAN: I had a lengthy discussion with the patient today about his condition and further investigation to confirm his diagnosis. I order several studies today including repeat CBC which showed absolute lymphocyte count of 5200. Comprehensive metabolic panel is unremarkable except for elevated  blood glucose of 146. I also order flow cytometry of the peripheral blood with CLL panel. The patient has no palpable lymphadenopathy and currently asymptomatic with no splenomegaly, anemia, thrombocytopenia or liver dysfunction. I explained to the patient that he is likely to have underlying chronic lymphocytic leukemia pending the confirmation by the flow cytometry. I will continue the patient on observation even if the flow cytometry is consistent with chronic lymphocytic leukemia.  I will see him back for follow-up visit in 6 months with repeat blood work. The patient was advised to call immediately if he has any other concerning issues in the interval. The patient and his wife are in agreement with the current plan.  The patient voices understanding of current disease status and treatment options and is in agreement with the current care plan.  All questions were answered. The patient knows to call the clinic with any problems, questions or concerns.  We can certainly see the patient much sooner if necessary.  Thank you so much for allowing me to participate in the care of Samuel Huang. I will continue to follow up the patient with you and assist in his care.  The total time spent in the appointment was 60 minutes.  Disclaimer: This note was dictated with voice recognition software. Similar sounding words can inadvertently be transcribed and may not be corrected upon review.   Samuel Huang Mar 11, 2022, 12:06 PM

## 2022-03-11 NOTE — Telephone Encounter (Signed)
Per 5/22 los called and spoke to pt about 6 mo appointment.  Pt confirmed appointment

## 2022-03-12 LAB — SURGICAL PATHOLOGY

## 2022-03-19 LAB — FLOW CYTOMETRY

## 2022-05-13 ENCOUNTER — Telehealth: Payer: Self-pay | Admitting: Internal Medicine

## 2022-05-13 NOTE — Telephone Encounter (Signed)
Called patient regarding upcoming rescheduled November appointments, patient is notified. 

## 2022-08-28 ENCOUNTER — Encounter: Payer: Self-pay | Admitting: Internal Medicine

## 2022-09-02 ENCOUNTER — Telehealth: Payer: Self-pay | Admitting: Internal Medicine

## 2022-09-02 NOTE — Telephone Encounter (Signed)
Rescheduled 11/29 appointment to 11/30 due to provider pal, patient has been called and voicemail was left.

## 2022-09-10 ENCOUNTER — Other Ambulatory Visit: Payer: Self-pay

## 2022-09-10 ENCOUNTER — Ambulatory Visit: Payer: Self-pay | Admitting: Internal Medicine

## 2022-09-16 ENCOUNTER — Telehealth: Payer: Self-pay | Admitting: Physician Assistant

## 2022-09-16 NOTE — Telephone Encounter (Signed)
Patient would like to move 12/06 appointment to 11/29 due to patient being out of town. Appointment rescheduled.

## 2022-09-17 ENCOUNTER — Telehealth: Payer: Self-pay | Admitting: Internal Medicine

## 2022-09-17 NOTE — Telephone Encounter (Signed)
Patient called confused about his upcoming appointment. He is leaving the country in Dec. Moved patient appointments per patient availability.

## 2022-09-18 ENCOUNTER — Ambulatory Visit: Payer: Self-pay | Admitting: Internal Medicine

## 2022-09-18 ENCOUNTER — Inpatient Hospital Stay: Payer: 59 | Admitting: Physician Assistant

## 2022-09-18 ENCOUNTER — Other Ambulatory Visit: Payer: Self-pay

## 2022-09-18 ENCOUNTER — Inpatient Hospital Stay: Payer: 59

## 2022-09-18 NOTE — Progress Notes (Unsigned)
[facility] OFFICE PROGRESS NOTE  Samuel Joe, MD [phone] Samuel Huang [address] [place] [address]  DIAGNOSIS: Lymphocytosis likely B cell lymphoproliferative disorder   PRIOR THERAPY: None  CURRENT THERAPY: Observation   INTERVAL HISTORY: Samuel Huang 71 y.o. male returns to the clinic for a 6 month follow up visit. The patient was last seen by Dr. Arbutus Huang on 03/11/22. The patient was referred to the clinic for leukocytosis. The patient had flow cytometry performed in May 2023 which showed monoclonal B-cell populations with two monoclonal B cell populations.  The findings are consistent with B-cell lymphoproliferative disorder(s). In the absence of lymphadenopathy, organomegaly or extra medullary tissue involvement, the changes likely represent monoclonal B-cell lymphocytosis.  However, clinical correlation is strongly recommended since early involvement by B-cell lymphoma is also a possibility.   Since last being seen, the patient denies any changes in his health. Denies any fever, chills, night sweat, or unexplained weight loss. He would like to lose weight due to his strong family history of diabetes. Denies lymphadenopathy. He denies fatigue. He denies any abnormal bleeding or bruising. Denies any abdominal bloating, early satiety, or abdominal pain. Denies frequent or recent infections. He is going on vacation to [place] this weekend until 12/15. He is here for evaluation and repeat blood work.    MEDICAL HISTORY: Past Medical History:  Diagnosis Date   Anxiety    BPH (benign prostatic hyperplasia)    Cataract    Depression    work related/ retired   ED (erectile dysfunction)    GERD (gastroesophageal reflux disease)    Hemorrhoids    Hyperlipidemia    Hypertension    Insomnia    Osteopenia    Vitamin D deficiency     ALLERGIES:  is allergic to penicillins.  MEDICATIONS:  Current Outpatient Medications  Medication Sig Dispense Refill   amLODipine  (NORVASC) 2.5 MG tablet Take 2.5 mg by mouth daily.     Ascorbic Acid (VITAMIN C) 500 MG CHEW Chew 500 mg by mouth daily.     carvedilol (COREG) 6.25 MG tablet Take 1 tablet by mouth 2 (two) times daily.     Cholecalciferol (VITAMIN D) 2000 units CAPS Take 1 capsule by mouth daily.      losartan (COZAAR) 100 MG tablet Take 1 tablet by mouth daily.      sildenafil (REVATIO) 20 MG tablet Take 20 mg by mouth as needed.     simvastatin (ZOCOR) 20 MG tablet Take 1 tablet by mouth daily.     telmisartan (MICARDIS) 80 MG tablet Take 80 mg by mouth daily.     No current facility-administered medications for this visit.    SURGICAL HISTORY:  Past Surgical History:  Procedure Laterality Date   COLONOSCOPY     FINGER FRACTURE SURGERY Left    Pinky finger    REVIEW OF SYSTEMS:   Review of Systems  Constitutional: Negative for appetite change, chills, fatigue, fever and unexpected weight change.  HENT:   Negative for mouth sores, nosebleeds, sore throat and trouble swallowing.   Eyes: Negative for eye problems and icterus.  Respiratory: Negative for cough, hemoptysis, shortness of breath and wheezing.   Cardiovascular: Negative for chest pain and leg swelling.  Gastrointestinal: Negative for abdominal pain, constipation, diarrhea, nausea and vomiting.  Genitourinary: Negative for bladder incontinence, difficulty urinating, dysuria, frequency and hematuria.   Musculoskeletal: Negative for back pain, gait problem, neck pain and neck stiffness.  Skin: Negative for itching and rash.  Neurological: Negative for dizziness, extremity weakness, gait problem, headaches, light-headedness and seizures.  Hematological: Negative for adenopathy. Does not bruise/bleed easily.  Psychiatric/Behavioral: Negative for confusion, depression and sleep disturbance. The patient is not nervous/anxious.     PHYSICAL EXAMINATION:  There were no vitals taken for this visit.  ECOG PERFORMANCE STATUS: 0  Physical Exam   Constitutional: Oriented to person, place, and time and well-developed, well-nourished, and in no distress.  HENT:  Head: Normocephalic and atraumatic.  Mouth/Throat: Oropharynx is clear and moist. No oropharyngeal exudate.  Eyes: Conjunctivae are normal. Right eye exhibits no discharge. Left eye exhibits no discharge. No scleral icterus.  Neck: Normal range of motion. Neck supple.  Cardiovascular: Normal rate, regular rhythm, normal heart sounds and intact distal pulses.   Pulmonary/Chest: Effort normal and breath sounds normal. No respiratory distress. No wheezes. No rales.  Abdominal: Soft. Bowel sounds are normal. Exhibits no distension and no mass. There is no tenderness.  Musculoskeletal: Normal range of motion. Exhibits no edema.  Lymphadenopathy:    No cervical adenopathy.  Neurological: Alert and oriented to person, place, and time. Exhibits normal muscle tone. Gait normal. Coordination normal.  Skin: Skin is warm and dry. No rash noted. Not diaphoretic. No erythema. No pallor.  Psychiatric: Mood, memory and judgment normal.  Vitals reviewed.  LABORATORY DATA: Lab Results  Component Value Date   WBC 10.4 03/11/2022   HGB 15.3 03/11/2022   HCT 45.0 03/11/2022   MCV 94.3 03/11/2022   PLT 249 03/11/2022      Chemistry      Component Value Date/Time   NA 137 03/11/2022 1122   K 4.7 03/11/2022 1122   CL 103 03/11/2022 1122   CO2 31 03/11/2022 1122   BUN 16 03/11/2022 1122   CREATININE 1.09 03/11/2022 1122      Component Value Date/Time   CALCIUM 9.9 03/11/2022 1122   ALKPHOS 49 03/11/2022 1122   AST 28 03/11/2022 1122   ALT 26 03/11/2022 1122   BILITOT 0.5 03/11/2022 1122       RADIOGRAPHIC STUDIES:  No results found.   ASSESSMENT/PLAN:  This is a very pleasant 71 year old caucasian male presented for evaluation of lymphocytosis.   He had flow cytometry performed in May 2023 which showed monoclonal B-cell populations with two monoclonal B cell populations.   The findings are consistent with B-cell lymphoproliferative disorder(s). In the absence of lymphadenopathy, organomegaly or extra medullary tissue involvement, the changes likely represent monoclonal B-cell lymphocytosis.  However, clinical correlation is strongly recommended since early involvement by B-cell lymphoma is also a possibility.   I discussed this with Dr. Mosetta Putt today. He had a CBC today which showed slight increase in WBC to 11.2 compared to 10.4 6 months ago. Slight increase in lymphocytes to 6.2 from 5.2. LDH normal. He has slightly elevated total protein at 8.2.   Overall, still suspicious for chronic lymphocytic leukemia.  The patient has no palpable lymphadenopathy and currently asymptomatic with no splenomegaly, anemia, thrombocytopenia or liver dysfunction.   We will continue to monitor him closely and arrange follow up in about 3-6 months. I will discuss with Dr. Arbutus Huang upon his return if any CT imaging is needed to ensure no organomegaly or intraabdominal adenopathy.   I told the patient I will call him upon his return from his trip with Dr. Asa Lente recommendation.   The patient was advised to call immediately if he has any concerning symptoms in the interval. The patient voices understanding of current disease  status and treatment options and is in agreement with the current care plan. All questions were answered. The patient knows to call the clinic with any problems, questions or concerns. We can certainly see the patient much sooner if necessary      No orders of the defined types were placed in this encounter.    The total time spent in the appointment was 20-29 minutes.   Stewart Pimenta L Ayris Carano, PA-C 09/18/22

## 2022-09-19 ENCOUNTER — Other Ambulatory Visit: Payer: Self-pay

## 2022-09-19 ENCOUNTER — Inpatient Hospital Stay: Payer: 59 | Admitting: Physician Assistant

## 2022-09-19 ENCOUNTER — Ambulatory Visit: Payer: 59 | Admitting: Physician Assistant

## 2022-09-19 ENCOUNTER — Other Ambulatory Visit: Payer: 59

## 2022-09-19 ENCOUNTER — Inpatient Hospital Stay: Payer: No Typology Code available for payment source | Attending: Physician Assistant

## 2022-09-19 ENCOUNTER — Inpatient Hospital Stay (HOSPITAL_BASED_OUTPATIENT_CLINIC_OR_DEPARTMENT_OTHER): Payer: No Typology Code available for payment source | Admitting: Physician Assistant

## 2022-09-19 ENCOUNTER — Inpatient Hospital Stay: Payer: 59

## 2022-09-19 VITALS — BP 149/85 | HR 64 | Temp 97.9°F | Resp 15 | Wt 167.8 lb

## 2022-09-19 DIAGNOSIS — D72820 Lymphocytosis (symptomatic): Secondary | ICD-10-CM | POA: Insufficient documentation

## 2022-09-19 DIAGNOSIS — I10 Essential (primary) hypertension: Secondary | ICD-10-CM | POA: Diagnosis not present

## 2022-09-19 LAB — CMP (CANCER CENTER ONLY)
ALT: 28 U/L (ref 0–44)
AST: 28 U/L (ref 15–41)
Albumin: 4.7 g/dL (ref 3.5–5.0)
Alkaline Phosphatase: 52 U/L (ref 38–126)
Anion gap: 6 (ref 5–15)
BUN: 14 mg/dL (ref 8–23)
CO2: 31 mmol/L (ref 22–32)
Calcium: 10.2 mg/dL (ref 8.9–10.3)
Chloride: 102 mmol/L (ref 98–111)
Creatinine: 0.94 mg/dL (ref 0.61–1.24)
GFR, Estimated: >60 mL/min (ref >60)
Glucose, Bld: 123 mg/dL — ABNORMAL HIGH (ref 70–99)
Potassium: 4.8 mmol/L (ref 3.5–5.1)
Sodium: 139 mmol/L (ref 135–145)
Total Bilirubin: 0.7 mg/dL (ref 0.3–1.2)
Total Protein: 8.2 g/dL — ABNORMAL HIGH (ref 6.5–8.1)

## 2022-09-19 LAB — CBC WITH DIFFERENTIAL (CANCER CENTER ONLY)
Abs Immature Granulocytes: 0.02 10*3/uL (ref 0.00–0.07)
Basophils Absolute: 0.1 10*3/uL (ref 0.0–0.1)
Basophils Relative: 1 %
Eosinophils Absolute: 0.2 10*3/uL (ref 0.0–0.5)
Eosinophils Relative: 2 %
HCT: 48.2 % (ref 39.0–52.0)
Hemoglobin: 16.3 g/dL (ref 13.0–17.0)
Immature Granulocytes: 0 %
Lymphocytes Relative: 55 %
Lymphs Abs: 6.2 10*3/uL — ABNORMAL HIGH (ref 0.7–4.0)
MCH: 31.9 pg (ref 26.0–34.0)
MCHC: 33.8 g/dL (ref 30.0–36.0)
MCV: 94.3 fL (ref 80.0–100.0)
Monocytes Absolute: 0.6 10*3/uL (ref 0.1–1.0)
Monocytes Relative: 5 %
Neutro Abs: 4.1 10*3/uL (ref 1.7–7.7)
Neutrophils Relative %: 37 %
Platelet Count: 241 10*3/uL (ref 150–400)
RBC: 5.11 MIL/uL (ref 4.22–5.81)
RDW: 12 % (ref 11.5–15.5)
Smear Review: NORMAL
WBC Count: 11.2 10*3/uL — ABNORMAL HIGH (ref 4.0–10.5)
nRBC: 0 % (ref 0.0–0.2)

## 2022-09-19 LAB — LACTATE DEHYDROGENASE: LDH: 113 U/L (ref 98–192)

## 2022-09-25 ENCOUNTER — Ambulatory Visit: Payer: 59 | Admitting: Physician Assistant

## 2022-09-25 ENCOUNTER — Inpatient Hospital Stay: Payer: No Typology Code available for payment source

## 2022-09-25 ENCOUNTER — Inpatient Hospital Stay: Payer: No Typology Code available for payment source | Admitting: Physician Assistant

## 2022-09-25 ENCOUNTER — Other Ambulatory Visit: Payer: Self-pay

## 2022-09-30 ENCOUNTER — Telehealth: Payer: Self-pay

## 2022-09-30 NOTE — Telephone Encounter (Signed)
This nurse reached out to patient who left a message requesting last office notes and lab results be faxed over to his primary car physician.  This nurse faxed requested documents to Dr. [name].  Patient has no further questions or concerns at this time.

## 2022-10-08 ENCOUNTER — Ambulatory Visit: Payer: 59 | Admitting: Internal Medicine

## 2022-10-08 ENCOUNTER — Other Ambulatory Visit: Payer: 59

## 2022-10-25 ENCOUNTER — Telehealth: Payer: Self-pay

## 2022-10-25 NOTE — Telephone Encounter (Signed)
This nurse reached out to patient and made him aware that the provider is in agreement with follow up and labs in 6 months.  No scans are needed at this time.  Patient is in agreement with scheduled appointments on 03/20/23.  No further questions or concerns.

## 2022-11-11 DIAGNOSIS — R051 Acute cough: Secondary | ICD-10-CM | POA: Diagnosis not present

## 2022-11-11 DIAGNOSIS — J069 Acute upper respiratory infection, unspecified: Secondary | ICD-10-CM | POA: Diagnosis not present

## 2022-11-11 DIAGNOSIS — Z03818 Encounter for observation for suspected exposure to other biological agents ruled out: Secondary | ICD-10-CM | POA: Diagnosis not present

## 2022-11-28 DIAGNOSIS — J019 Acute sinusitis, unspecified: Secondary | ICD-10-CM | POA: Diagnosis not present

## 2022-12-02 DIAGNOSIS — H26493 Other secondary cataract, bilateral: Secondary | ICD-10-CM | POA: Diagnosis not present

## 2022-12-03 DIAGNOSIS — R3915 Urgency of urination: Secondary | ICD-10-CM | POA: Diagnosis not present

## 2022-12-03 DIAGNOSIS — N486 Induration penis plastica: Secondary | ICD-10-CM | POA: Diagnosis not present

## 2022-12-03 DIAGNOSIS — N5201 Erectile dysfunction due to arterial insufficiency: Secondary | ICD-10-CM | POA: Diagnosis not present

## 2022-12-09 DIAGNOSIS — H26491 Other secondary cataract, right eye: Secondary | ICD-10-CM | POA: Diagnosis not present

## 2022-12-31 DIAGNOSIS — H16223 Keratoconjunctivitis sicca, not specified as Sjogren's, bilateral: Secondary | ICD-10-CM | POA: Diagnosis not present

## 2023-02-25 DIAGNOSIS — N529 Male erectile dysfunction, unspecified: Secondary | ICD-10-CM | POA: Diagnosis not present

## 2023-02-25 DIAGNOSIS — I10 Essential (primary) hypertension: Secondary | ICD-10-CM | POA: Diagnosis not present

## 2023-02-25 DIAGNOSIS — D72829 Elevated white blood cell count, unspecified: Secondary | ICD-10-CM | POA: Diagnosis not present

## 2023-02-25 DIAGNOSIS — M85851 Other specified disorders of bone density and structure, right thigh: Secondary | ICD-10-CM | POA: Diagnosis not present

## 2023-02-25 DIAGNOSIS — J309 Allergic rhinitis, unspecified: Secondary | ICD-10-CM | POA: Diagnosis not present

## 2023-02-25 DIAGNOSIS — E1169 Type 2 diabetes mellitus with other specified complication: Secondary | ICD-10-CM | POA: Diagnosis not present

## 2023-02-25 DIAGNOSIS — E782 Mixed hyperlipidemia: Secondary | ICD-10-CM | POA: Diagnosis not present

## 2023-03-20 ENCOUNTER — Inpatient Hospital Stay (HOSPITAL_BASED_OUTPATIENT_CLINIC_OR_DEPARTMENT_OTHER): Payer: Medicare Other | Admitting: Internal Medicine

## 2023-03-20 ENCOUNTER — Inpatient Hospital Stay: Payer: Medicare Other | Attending: Internal Medicine

## 2023-03-20 VITALS — BP 130/78 | HR 71 | Temp 98.3°F | Resp 18 | Wt 168.2 lb

## 2023-03-20 DIAGNOSIS — R591 Generalized enlarged lymph nodes: Secondary | ICD-10-CM

## 2023-03-20 DIAGNOSIS — D72820 Lymphocytosis (symptomatic): Secondary | ICD-10-CM

## 2023-03-20 DIAGNOSIS — I10 Essential (primary) hypertension: Secondary | ICD-10-CM | POA: Diagnosis not present

## 2023-03-20 DIAGNOSIS — M8580 Other specified disorders of bone density and structure, unspecified site: Secondary | ICD-10-CM | POA: Insufficient documentation

## 2023-03-20 DIAGNOSIS — E785 Hyperlipidemia, unspecified: Secondary | ICD-10-CM | POA: Diagnosis not present

## 2023-03-20 DIAGNOSIS — E559 Vitamin D deficiency, unspecified: Secondary | ICD-10-CM | POA: Diagnosis not present

## 2023-03-20 DIAGNOSIS — K219 Gastro-esophageal reflux disease without esophagitis: Secondary | ICD-10-CM | POA: Insufficient documentation

## 2023-03-20 LAB — CMP (CANCER CENTER ONLY)
ALT: 21 U/L (ref 0–44)
AST: 27 U/L (ref 15–41)
Albumin: 4.3 g/dL (ref 3.5–5.0)
Alkaline Phosphatase: 51 U/L (ref 38–126)
Anion gap: 7 (ref 5–15)
BUN: 17 mg/dL (ref 8–23)
CO2: 29 mmol/L (ref 22–32)
Calcium: 9.4 mg/dL (ref 8.9–10.3)
Chloride: 100 mmol/L (ref 98–111)
Creatinine: 1.05 mg/dL (ref 0.61–1.24)
GFR, Estimated: >60 mL/min (ref >60)
Glucose, Bld: 224 mg/dL — ABNORMAL HIGH (ref 70–99)
Potassium: 4.5 mmol/L (ref 3.5–5.1)
Sodium: 136 mmol/L (ref 135–145)
Total Bilirubin: 0.8 mg/dL (ref 0.3–1.2)
Total Protein: 7.5 g/dL (ref 6.5–8.1)

## 2023-03-20 LAB — CBC WITH DIFFERENTIAL (CANCER CENTER ONLY)
Abs Immature Granulocytes: 0.01 10*3/uL (ref 0.00–0.07)
Basophils Absolute: 0.1 10*3/uL (ref 0.0–0.1)
Basophils Relative: 1 %
Eosinophils Absolute: 0.1 10*3/uL (ref 0.0–0.5)
Eosinophils Relative: 1 %
HCT: 44.5 % (ref 39.0–52.0)
Hemoglobin: 15.1 g/dL (ref 13.0–17.0)
Immature Granulocytes: 0 %
Lymphocytes Relative: 63 %
Lymphs Abs: 6.7 10*3/uL — ABNORMAL HIGH (ref 0.7–4.0)
MCH: 31.8 pg (ref 26.0–34.0)
MCHC: 33.9 g/dL (ref 30.0–36.0)
MCV: 93.7 fL (ref 80.0–100.0)
Monocytes Absolute: 0.6 10*3/uL (ref 0.1–1.0)
Monocytes Relative: 6 %
Neutro Abs: 3.1 10*3/uL (ref 1.7–7.7)
Neutrophils Relative %: 29 %
Platelet Count: 249 10*3/uL (ref 150–400)
RBC: 4.75 MIL/uL (ref 4.22–5.81)
RDW: 12 % (ref 11.5–15.5)
Smear Review: NORMAL
WBC Count: 10.6 10*3/uL — ABNORMAL HIGH (ref 4.0–10.5)
nRBC: 0 % (ref 0.0–0.2)

## 2023-03-20 LAB — LACTATE DEHYDROGENASE: LDH: 118 U/L (ref 98–192)

## 2023-03-20 NOTE — Progress Notes (Signed)
[facility] [phone]   Fax:([phone]) [phone]  OFFICE PROGRESS NOTE  Samuel Joe, MD [phone] Samuel [address] [place] [address]  DIAGNOSIS: Lymphocytosis likely B cell lymphoproliferative disorder    PRIOR THERAPY: None   CURRENT THERAPY: Observation   INTERVAL HISTORY: Samuel Huang 72 y.o. male returns to the clinic today for follow-up visit.  The patient is feeling fine today with no concerning complaints he is very active and no limitation.  He travels a lot.  He denied having any current chest pain, shortness of breath, cough or hemoptysis.  He has no nausea, vomiting, diarrhea or constipation.  He has no headache or visual changes.  He has no bleeding, bruises or ecchymosis.  He has no palpable lymphadenopathy.  He is here today for evaluation and repeat blood work.  MEDICAL HISTORY: Past Medical History:  Diagnosis Date   Anxiety    BPH (benign prostatic hyperplasia)    Cataract    Depression    work related/ retired   ED (erectile dysfunction)    GERD (gastroesophageal reflux disease)    Hemorrhoids    Hyperlipidemia    Hypertension    Insomnia    Osteopenia    Vitamin D deficiency     ALLERGIES:  is allergic to penicillins.  MEDICATIONS:  Current Outpatient Medications  Medication Sig Dispense Refill   amLODipine (NORVASC) 2.5 MG tablet Take 2.5 mg by mouth daily.     Ascorbic Acid (VITAMIN C) 500 MG CHEW Chew 500 mg by mouth daily.     carvedilol (COREG) 6.25 MG tablet Take 1 tablet by mouth 2 (two) times daily.     Cholecalciferol (VITAMIN D) 2000 units CAPS Take 1 capsule by mouth daily.      losartan (COZAAR) 100 MG tablet Take 1 tablet by mouth daily.      sildenafil (REVATIO) 20 MG tablet Take 20 mg by mouth as needed.     simvastatin (ZOCOR) 20 MG tablet Take 1 tablet by mouth daily.     No current facility-administered medications for this visit.    SURGICAL HISTORY:  Past Surgical History:  Procedure  Laterality Date   COLONOSCOPY     FINGER FRACTURE SURGERY Left    Pinky finger    REVIEW OF SYSTEMS:  A comprehensive review of systems was negative.   PHYSICAL EXAMINATION: General appearance: alert, cooperative, and no distress Head: Normocephalic, without obvious abnormality, atraumatic Neck: no adenopathy, no JVD, supple, symmetrical, trachea midline, and thyroid not enlarged, symmetric, no tenderness/mass/nodules Lymph nodes: Cervical, supraclavicular, and axillary nodes normal. Resp: clear to auscultation bilaterally Back: symmetric, no curvature. ROM normal. No CVA tenderness. Cardio: regular rate and rhythm, S1, S2 normal, no murmur, click, rub or gallop GI: soft, non-tender; bowel sounds normal; no masses,  no organomegaly Extremities: extremities normal, atraumatic, no cyanosis or edema  ECOG PERFORMANCE STATUS: 0 - Asymptomatic  Blood pressure 130/78, pulse 71, temperature 98.3 F (36.8 C), temperature source Oral, resp. rate 18, weight 168 lb 3.2 oz (76.3 kg), SpO2 98 %.  LABORATORY DATA: Lab Results  Component Value Date   WBC 10.6 (H) 03/20/2023   HGB 15.1 03/20/2023   HCT 44.5 03/20/2023   MCV 93.7 03/20/2023   PLT 249 03/20/2023      Chemistry      Component Value Date/Time   NA 139 09/19/2022 0806   K 4.8 09/19/2022 0806   CL 102 09/19/2022 0806   CO2 31 09/19/2022  0806   BUN 14 09/19/2022 0806   CREATININE 0.94 09/19/2022 0806      Component Value Date/Time   CALCIUM 10.2 09/19/2022 0806   ALKPHOS 52 09/19/2022 0806   AST 28 09/19/2022 0806   ALT 28 09/19/2022 0806   BILITOT 0.7 09/19/2022 0806       RADIOGRAPHIC STUDIES: No results found.  ASSESSMENT AND PLAN: This is a very pleasant 72 years old white male with persistent lymphocytosis suspicious for underlying myeloproliferative disorder. The patient is currently asymptomatic and feeling well. Repeat CBC today showed total white blood count of 10.6 with normal hemoglobin and hematocrit as  well as platelet count.  The differential is still pending.  Comprehensive metabolic panel is unremarkable except for hyperglycemia and he is borderline diabetic. I recommended for the patient to continue on observation for now but we will repeat blood work as well as CT scan of the chest, abdomen and pelvis in 3 months to rule out any lymphadenopathy or other underlying lymphoproliferative disorder. The patient was advised to call immediately if he has any other concerning symptoms in the interval. The patient voices understanding of current disease status and treatment options and is in agreement with the current care plan.  All questions were answered. The patient knows to call the clinic with any problems, questions or concerns. We can certainly see the patient much sooner if necessary.  The total time spent in the appointment was 20 minutes.  Disclaimer: This note was dictated with voice recognition software. Similar sounding words can inadvertently be transcribed and may not be corrected upon review.

## 2023-04-02 DIAGNOSIS — L814 Other melanin hyperpigmentation: Secondary | ICD-10-CM | POA: Diagnosis not present

## 2023-04-02 DIAGNOSIS — L821 Other seborrheic keratosis: Secondary | ICD-10-CM | POA: Diagnosis not present

## 2023-04-02 DIAGNOSIS — L538 Other specified erythematous conditions: Secondary | ICD-10-CM | POA: Diagnosis not present

## 2023-04-02 DIAGNOSIS — Z789 Other specified health status: Secondary | ICD-10-CM | POA: Diagnosis not present

## 2023-04-02 DIAGNOSIS — D225 Melanocytic nevi of trunk: Secondary | ICD-10-CM | POA: Diagnosis not present

## 2023-04-02 DIAGNOSIS — L820 Inflamed seborrheic keratosis: Secondary | ICD-10-CM | POA: Diagnosis not present

## 2023-04-02 DIAGNOSIS — L298 Other pruritus: Secondary | ICD-10-CM | POA: Diagnosis not present

## 2023-04-09 ENCOUNTER — Encounter: Payer: Self-pay | Admitting: Internal Medicine

## 2023-05-16 DIAGNOSIS — J019 Acute sinusitis, unspecified: Secondary | ICD-10-CM | POA: Diagnosis not present

## 2023-05-16 DIAGNOSIS — U071 COVID-19: Secondary | ICD-10-CM | POA: Diagnosis not present

## 2023-06-19 ENCOUNTER — Inpatient Hospital Stay: Payer: Medicare Other | Attending: Internal Medicine

## 2023-06-19 ENCOUNTER — Ambulatory Visit (HOSPITAL_COMMUNITY)
Admission: RE | Admit: 2023-06-19 | Discharge: 2023-06-19 | Disposition: A | Discharge status: Home / Self Care | Payer: Medicare Other | Source: Ambulatory Visit | Attending: Internal Medicine | Admitting: Internal Medicine

## 2023-06-19 DIAGNOSIS — R590 Localized enlarged lymph nodes: Secondary | ICD-10-CM | POA: Insufficient documentation

## 2023-06-19 DIAGNOSIS — D72820 Lymphocytosis (symptomatic): Secondary | ICD-10-CM | POA: Insufficient documentation

## 2023-06-19 DIAGNOSIS — R591 Generalized enlarged lymph nodes: Secondary | ICD-10-CM | POA: Insufficient documentation

## 2023-06-19 DIAGNOSIS — K5730 Diverticulosis of large intestine without perforation or abscess without bleeding: Secondary | ICD-10-CM | POA: Insufficient documentation

## 2023-06-19 DIAGNOSIS — N320 Bladder-neck obstruction: Secondary | ICD-10-CM | POA: Diagnosis not present

## 2023-06-19 DIAGNOSIS — K7689 Other specified diseases of liver: Secondary | ICD-10-CM | POA: Diagnosis not present

## 2023-06-19 LAB — CBC WITH DIFFERENTIAL (CANCER CENTER ONLY)
Abs Immature Granulocytes: 0.01 10*3/uL (ref 0.00–0.07)
Basophils Absolute: 0.1 10*3/uL (ref 0.0–0.1)
Basophils Relative: 1 %
Eosinophils Absolute: 0.2 10*3/uL (ref 0.0–0.5)
Eosinophils Relative: 2 %
HCT: 44.6 % (ref 39.0–52.0)
Hemoglobin: 14.9 g/dL (ref 13.0–17.0)
Immature Granulocytes: 0 %
Lymphocytes Relative: 54 %
Lymphs Abs: 6.1 10*3/uL — ABNORMAL HIGH (ref 0.7–4.0)
MCH: 31.6 pg (ref 26.0–34.0)
MCHC: 33.4 g/dL (ref 30.0–36.0)
MCV: 94.5 fL (ref 80.0–100.0)
Monocytes Absolute: 0.7 10*3/uL (ref 0.1–1.0)
Monocytes Relative: 6 %
Neutro Abs: 4 10*3/uL (ref 1.7–7.7)
Neutrophils Relative %: 37 %
Platelet Count: 236 10*3/uL (ref 150–400)
RBC: 4.72 MIL/uL (ref 4.22–5.81)
RDW: 12.8 % (ref 11.5–15.5)
Smear Review: NORMAL
WBC Count: 11.1 10*3/uL — ABNORMAL HIGH (ref 4.0–10.5)
nRBC: 0 % (ref 0.0–0.2)

## 2023-06-19 LAB — CMP (CANCER CENTER ONLY)
ALT: 33 U/L (ref 0–44)
AST: 36 U/L (ref 15–41)
Albumin: 4.2 g/dL (ref 3.5–5.0)
Alkaline Phosphatase: 48 U/L (ref 38–126)
Anion gap: 6 (ref 5–15)
BUN: 16 mg/dL (ref 8–23)
CO2: 29 mmol/L (ref 22–32)
Calcium: 9.5 mg/dL (ref 8.9–10.3)
Chloride: 102 mmol/L (ref 98–111)
Creatinine: 1 mg/dL (ref 0.61–1.24)
GFR, Estimated: >60 mL/min (ref >60)
Glucose, Bld: 136 mg/dL — ABNORMAL HIGH (ref 70–99)
Potassium: 4.5 mmol/L (ref 3.5–5.1)
Sodium: 137 mmol/L (ref 135–145)
Total Bilirubin: 0.7 mg/dL (ref 0.3–1.2)
Total Protein: 7.4 g/dL (ref 6.5–8.1)

## 2023-06-19 LAB — LACTATE DEHYDROGENASE: LDH: 114 U/L (ref 98–192)

## 2023-06-19 MED ORDER — SODIUM CHLORIDE (PF) 0.9 % IJ SOLN
INTRAMUSCULAR | Status: AC
  Filled 2023-06-19: qty 50

## 2023-06-19 MED ORDER — IOHEXOL 300 MG/ML  SOLN
100.0000 mL | Freq: Once | INTRAMUSCULAR | Status: AC | PRN
  Administered 2023-06-19: 100 mL via INTRAVENOUS

## 2023-06-24 ENCOUNTER — Inpatient Hospital Stay: Payer: Medicare Other | Attending: Internal Medicine | Admitting: Internal Medicine

## 2023-06-24 VITALS — BP 145/80 | HR 68 | Temp 98.1°F | Resp 17 | Ht 66.0 in | Wt 169.8 lb

## 2023-06-24 DIAGNOSIS — K219 Gastro-esophageal reflux disease without esophagitis: Secondary | ICD-10-CM | POA: Insufficient documentation

## 2023-06-24 DIAGNOSIS — E559 Vitamin D deficiency, unspecified: Secondary | ICD-10-CM | POA: Diagnosis not present

## 2023-06-24 DIAGNOSIS — Z79899 Other long term (current) drug therapy: Secondary | ICD-10-CM | POA: Insufficient documentation

## 2023-06-24 DIAGNOSIS — E785 Hyperlipidemia, unspecified: Secondary | ICD-10-CM | POA: Diagnosis not present

## 2023-06-24 DIAGNOSIS — D72820 Lymphocytosis (symptomatic): Secondary | ICD-10-CM | POA: Diagnosis not present

## 2023-06-24 DIAGNOSIS — M8580 Other specified disorders of bone density and structure, unspecified site: Secondary | ICD-10-CM | POA: Diagnosis not present

## 2023-06-24 DIAGNOSIS — Z8719 Personal history of other diseases of the digestive system: Secondary | ICD-10-CM | POA: Diagnosis not present

## 2023-06-24 DIAGNOSIS — I10 Essential (primary) hypertension: Secondary | ICD-10-CM | POA: Insufficient documentation

## 2023-06-24 DIAGNOSIS — M47816 Spondylosis without myelopathy or radiculopathy, lumbar region: Secondary | ICD-10-CM | POA: Insufficient documentation

## 2023-06-24 NOTE — Progress Notes (Signed)
[facility] [phone]   Fax:([phone]  OFFICE PROGRESS NOTE  [name], MD [phone] [address] [place] [address]  DIAGNOSIS: Lymphocytosis likely B cell lymphoproliferative disorder    PRIOR THERAPY: None   CURRENT THERAPY: Observation   INTERVAL HISTORY: Samuel Huang 72 y.o. male returns to the clinic today for follow-up visit.  The patient is feeling fine today with no concerning complaints.  He denied having any current chest pain, shortness of breath, cough or hemoptysis.  He has no nausea, vomiting, diarrhea or constipation.  He has no headache or visual changes.  He denied having any recent weight loss or night sweats.  He has no fever or chills.  He had repeat CT scan of the chest, abdomen and pelvis in addition to blood work performed recently and he is here for evaluation and discussion of his lab and discuss results.  MEDICAL HISTORY: Past Medical History:  Diagnosis Date   Anxiety    BPH (benign prostatic hyperplasia)    Cataract    Depression    work related/ retired   ED (erectile dysfunction)    GERD (gastroesophageal reflux disease)    Hemorrhoids    Hyperlipidemia    Hypertension    Insomnia    Osteopenia    Vitamin D deficiency     ALLERGIES:  is allergic to penicillins.  MEDICATIONS:  Current Outpatient Medications  Medication Sig Dispense Refill   amLODipine (NORVASC) 2.5 MG tablet Take 2.5 mg by mouth daily.     Ascorbic Acid (VITAMIN C) 500 MG CHEW Chew 500 mg by mouth daily.     carvedilol (COREG) 6.25 MG tablet Take 1 tablet by mouth 2 (two) times daily.     Cholecalciferol (VITAMIN D) 2000 units CAPS Take 1 capsule by mouth daily.      losartan (COZAAR) 100 MG tablet Take 1 tablet by mouth daily.      sildenafil (REVATIO) 20 MG tablet Take 20 mg by mouth as needed.     simvastatin (ZOCOR) 20 MG tablet Take 1 tablet by mouth daily.     No current facility-administered medications for  this visit.    SURGICAL HISTORY:  Past Surgical History:  Procedure Laterality Date   COLONOSCOPY     FINGER FRACTURE SURGERY Left    Pinky finger    REVIEW OF SYSTEMS:  Constitutional: negative Eyes: negative Ears, nose, mouth, throat, and face: negative Respiratory: negative Cardiovascular: negative Gastrointestinal: negative Genitourinary:negative Integument/breast: negative Hematologic/lymphatic: negative Musculoskeletal:negative Neurological: negative Behavioral/Psych: negative Endocrine: negative Allergic/Immunologic: negative   PHYSICAL EXAMINATION: General appearance: alert, cooperative, and no distress Head: Normocephalic, without obvious abnormality, atraumatic Neck: no adenopathy, no JVD, supple, symmetrical, trachea midline, and thyroid not enlarged, symmetric, no tenderness/mass/nodules Lymph nodes: Cervical, supraclavicular, and axillary nodes normal. Resp: clear to auscultation bilaterally Back: symmetric, no curvature. ROM normal. No CVA tenderness. Cardio: regular rate and rhythm, S1, S2 normal, no murmur, click, rub or gallop GI: soft, non-tender; bowel sounds normal; no masses,  no organomegaly Extremities: extremities normal, atraumatic, no cyanosis or edema Neurologic: Alert and oriented X 3, normal strength and tone. Normal symmetric reflexes. Normal coordination and gait  ECOG PERFORMANCE STATUS: 0 - Asymptomatic  Blood pressure (!) 145/80, pulse 68, temperature 98.1 F (36.7 C), temperature source Oral, resp. rate 17, height 5\' 6"  (1.676 m), weight 169 lb 12.8 oz (77 kg), SpO2 99%.  LABORATORY DATA: Lab Results  Component Value Date   WBC  11.1 (H) 06/19/2023   HGB 14.9 06/19/2023   HCT 44.6 06/19/2023   MCV 94.5 06/19/2023   PLT 236 06/19/2023      Chemistry      Component Value Date/Time   NA 137 06/19/2023 0948   K 4.5 06/19/2023 0948   CL 102 06/19/2023 0948   CO2 29 06/19/2023 0948   BUN 16 06/19/2023 0948   CREATININE 1.00  06/19/2023 0948      Component Value Date/Time   CALCIUM 9.5 06/19/2023 0948   ALKPHOS 48 06/19/2023 0948   AST 36 06/19/2023 0948   ALT 33 06/19/2023 0948   BILITOT 0.7 06/19/2023 0948       RADIOGRAPHIC STUDIES: CT Chest W Contrast  Result Date: 06/21/2023 CLINICAL DATA:  Persistent lymphocytosis, suspicious for underlying myeloproliferative disorder. EXAM: CT CHEST, ABDOMEN, AND PELVIS WITH CONTRAST TECHNIQUE: Multidetector CT imaging of the chest, abdomen and pelvis was performed following the standard protocol during bolus administration of intravenous contrast. RADIATION DOSE REDUCTION: This exam was performed according to the departmental dose-optimization program which includes automated exposure control, adjustment of the mA and/or kV according to patient size and/or use of iterative reconstruction technique. CONTRAST:  OMNIPAQUE IOHEXOL 300 MG/ML  SOLN COMPARISON:  CT abdomen/pelvis dated 08/21/2020 FINDINGS: CT CHEST FINDINGS Cardiovascular: The heart is normal in size. No pericardial effusion. No evidence of thoracic aortic aneurysm. Mediastinum/Nodes: No suspicious mediastinal lymphadenopathy. Visualized thyroid is unremarkable. Lungs/Pleura: Mild dependent atelectasis in the bilateral lower lobes. No focal consolidation. No suspicious pulmonary nodules. No pleural effusion or pneumothorax. Musculoskeletal: Mild degenerative changes of the mid/lower thoracic spine. CT ABDOMEN PELVIS FINDINGS Hepatobiliary: 15 mm left hepatic lobe cysts (series 2/image 4), benign. Gallbladder is unremarkable. No intrahepatic or extrahepatic ductal dilatation. Pancreas: Within normal limits. Spleen: Spleen is normal in size. Adrenals/Urinary Tract: Adrenal glands are within normal limits. Kidneys are within normal limits.  No hydronephrosis. Thick-walled bladder, favoring sequela of chronic bladder outlet obstruction in this clinical context. Stomach/Bowel: Stomach is within normal limits. No evidence  of bowel obstruction. Normal appendix (series 2/image 86). Mild left colonic diverticulosis, without evidence of diverticulitis. Vascular/Lymphatic: No evidence of abdominal aortic aneurysm. 18 mm short axis node in the porta hepatis (series 2/image 60), chronic, previously 16 mm. Additional small upper abdominal nodes measuring up to 13 mm short axis (series 2/image 5). No suspicious retroperitoneal or pelvic lymphadenopathy. 7 mm short axis right external iliac node. Reproductive: Prostatomegaly, with enlargement of the central gland, suggesting BPH. Other: No abdominopelvic ascites. Musculoskeletal: Mild degenerative changes of the lumbar spine. IMPRESSION: Mild upper abdominal lymphadenopathy, as above. This appearance is chronic but mildly progressive. Overall, indolent lymphoma is possible but considered unlikely. No suspicious thoracic lymphadenopathy. No evidence of splenomegaly. Electronically Signed   By: Charline Bills M.D.   On: 06/21/2023 02:46   CT Abdomen Pelvis W Contrast  Result Date: 06/21/2023 CLINICAL DATA:  Persistent lymphocytosis, suspicious for underlying myeloproliferative disorder. EXAM: CT CHEST, ABDOMEN, AND PELVIS WITH CONTRAST TECHNIQUE: Multidetector CT imaging of the chest, abdomen and pelvis was performed following the standard protocol during bolus administration of intravenous contrast. RADIATION DOSE REDUCTION: This exam was performed according to the departmental dose-optimization program which includes automated exposure control, adjustment of the mA and/or kV according to patient size and/or use of iterative reconstruction technique. CONTRAST:  OMNIPAQUE IOHEXOL 300 MG/ML  SOLN COMPARISON:  CT abdomen/pelvis dated 08/21/2020 FINDINGS: CT CHEST FINDINGS Cardiovascular: The heart is normal in size. No pericardial effusion. No evidence of thoracic  aortic aneurysm. Mediastinum/Nodes: No suspicious mediastinal lymphadenopathy. Visualized thyroid is unremarkable.  Lungs/Pleura: Mild dependent atelectasis in the bilateral lower lobes. No focal consolidation. No suspicious pulmonary nodules. No pleural effusion or pneumothorax. Musculoskeletal: Mild degenerative changes of the mid/lower thoracic spine. CT ABDOMEN PELVIS FINDINGS Hepatobiliary: 15 mm left hepatic lobe cysts (series 2/image 4), benign. Gallbladder is unremarkable. No intrahepatic or extrahepatic ductal dilatation. Pancreas: Within normal limits. Spleen: Spleen is normal in size. Adrenals/Urinary Tract: Adrenal glands are within normal limits. Kidneys are within normal limits.  No hydronephrosis. Thick-walled bladder, favoring sequela of chronic bladder outlet obstruction in this clinical context. Stomach/Bowel: Stomach is within normal limits. No evidence of bowel obstruction. Normal appendix (series 2/image 86). Mild left colonic diverticulosis, without evidence of diverticulitis. Vascular/Lymphatic: No evidence of abdominal aortic aneurysm. 18 mm short axis node in the porta hepatis (series 2/image 60), chronic, previously 16 mm. Additional small upper abdominal nodes measuring up to 13 mm short axis (series 2/image 5). No suspicious retroperitoneal or pelvic lymphadenopathy. 7 mm short axis right external iliac node. Reproductive: Prostatomegaly, with enlargement of the central gland, suggesting BPH. Other: No abdominopelvic ascites. Musculoskeletal: Mild degenerative changes of the lumbar spine. IMPRESSION: Mild upper abdominal lymphadenopathy, as above. This appearance is chronic but mildly progressive. Overall, indolent lymphoma is possible but considered unlikely. No suspicious thoracic lymphadenopathy. No evidence of splenomegaly. Electronically Signed   By: Charline Bills M.D.   On: 06/21/2023 02:46    ASSESSMENT AND PLAN: This is a very pleasant 72 years old white male with persistent lymphocytosis suspicious for underlying myeloproliferative disorder. The patient is currently on observation and  he is feeling fine. He had repeat blood work that showed mild leukocytosis with total white blood count of 11.1 with normal hemoglobin, hematocrit and platelets count. He had CT scan of the chest, abdomen and pelvis performed recently that showed mild upper abdominal lymphadenopathy that is chronic but mildly progressive.  Indolent lymphoma is possible but considered unlikely and no suspicious thoracic lymphadenopathy. The patient also had several incidental finding on the scan that we discussed today. I recommended for him to continue on observation with repeat blood work in 6 months. He was advised to call immediately if he has any other concerning symptoms in the interval. The patient voices understanding of current disease status and treatment options and is in agreement with the current care plan.  All questions were answered. The patient knows to call the clinic with any problems, questions or concerns. We can certainly see the patient much sooner if necessary.  The total time spent in the appointment was 30 minutes.  Disclaimer: This note was dictated with voice recognition software. Similar sounding words can inadvertently be transcribed and may not be corrected upon review.

## 2023-06-25 NOTE — Addendum Note (Signed)
Addended by: Charma Igo on: 06/25/2023 09:05 AM   Modules accepted: Orders

## 2023-06-30 DIAGNOSIS — M542 Cervicalgia: Secondary | ICD-10-CM | POA: Diagnosis not present

## 2023-06-30 DIAGNOSIS — M9903 Segmental and somatic dysfunction of lumbar region: Secondary | ICD-10-CM | POA: Diagnosis not present

## 2023-06-30 DIAGNOSIS — M9901 Segmental and somatic dysfunction of cervical region: Secondary | ICD-10-CM | POA: Diagnosis not present

## 2023-06-30 DIAGNOSIS — M5451 Vertebrogenic low back pain: Secondary | ICD-10-CM | POA: Diagnosis not present

## 2023-07-02 DIAGNOSIS — M9903 Segmental and somatic dysfunction of lumbar region: Secondary | ICD-10-CM | POA: Diagnosis not present

## 2023-07-02 DIAGNOSIS — M5451 Vertebrogenic low back pain: Secondary | ICD-10-CM | POA: Diagnosis not present

## 2023-07-02 DIAGNOSIS — M9901 Segmental and somatic dysfunction of cervical region: Secondary | ICD-10-CM | POA: Diagnosis not present

## 2023-07-02 DIAGNOSIS — M542 Cervicalgia: Secondary | ICD-10-CM | POA: Diagnosis not present

## 2023-07-04 DIAGNOSIS — M9903 Segmental and somatic dysfunction of lumbar region: Secondary | ICD-10-CM | POA: Diagnosis not present

## 2023-07-04 DIAGNOSIS — M9901 Segmental and somatic dysfunction of cervical region: Secondary | ICD-10-CM | POA: Diagnosis not present

## 2023-07-04 DIAGNOSIS — M5451 Vertebrogenic low back pain: Secondary | ICD-10-CM | POA: Diagnosis not present

## 2023-07-04 DIAGNOSIS — M542 Cervicalgia: Secondary | ICD-10-CM | POA: Diagnosis not present

## 2023-07-07 DIAGNOSIS — M9901 Segmental and somatic dysfunction of cervical region: Secondary | ICD-10-CM | POA: Diagnosis not present

## 2023-07-07 DIAGNOSIS — M9903 Segmental and somatic dysfunction of lumbar region: Secondary | ICD-10-CM | POA: Diagnosis not present

## 2023-07-07 DIAGNOSIS — M5451 Vertebrogenic low back pain: Secondary | ICD-10-CM | POA: Diagnosis not present

## 2023-07-07 DIAGNOSIS — M542 Cervicalgia: Secondary | ICD-10-CM | POA: Diagnosis not present

## 2023-07-09 DIAGNOSIS — M5451 Vertebrogenic low back pain: Secondary | ICD-10-CM | POA: Diagnosis not present

## 2023-07-09 DIAGNOSIS — M9901 Segmental and somatic dysfunction of cervical region: Secondary | ICD-10-CM | POA: Diagnosis not present

## 2023-07-09 DIAGNOSIS — M542 Cervicalgia: Secondary | ICD-10-CM | POA: Diagnosis not present

## 2023-07-09 DIAGNOSIS — M9903 Segmental and somatic dysfunction of lumbar region: Secondary | ICD-10-CM | POA: Diagnosis not present

## 2023-07-14 DIAGNOSIS — M9903 Segmental and somatic dysfunction of lumbar region: Secondary | ICD-10-CM | POA: Diagnosis not present

## 2023-07-14 DIAGNOSIS — M542 Cervicalgia: Secondary | ICD-10-CM | POA: Diagnosis not present

## 2023-07-14 DIAGNOSIS — M5451 Vertebrogenic low back pain: Secondary | ICD-10-CM | POA: Diagnosis not present

## 2023-07-14 DIAGNOSIS — M9901 Segmental and somatic dysfunction of cervical region: Secondary | ICD-10-CM | POA: Diagnosis not present

## 2023-07-16 DIAGNOSIS — M9903 Segmental and somatic dysfunction of lumbar region: Secondary | ICD-10-CM | POA: Diagnosis not present

## 2023-07-16 DIAGNOSIS — M5451 Vertebrogenic low back pain: Secondary | ICD-10-CM | POA: Diagnosis not present

## 2023-07-16 DIAGNOSIS — M542 Cervicalgia: Secondary | ICD-10-CM | POA: Diagnosis not present

## 2023-07-16 DIAGNOSIS — M9901 Segmental and somatic dysfunction of cervical region: Secondary | ICD-10-CM | POA: Diagnosis not present

## 2023-07-17 DIAGNOSIS — M9901 Segmental and somatic dysfunction of cervical region: Secondary | ICD-10-CM | POA: Diagnosis not present

## 2023-07-17 DIAGNOSIS — M5451 Vertebrogenic low back pain: Secondary | ICD-10-CM | POA: Diagnosis not present

## 2023-07-17 DIAGNOSIS — M542 Cervicalgia: Secondary | ICD-10-CM | POA: Diagnosis not present

## 2023-07-17 DIAGNOSIS — M9903 Segmental and somatic dysfunction of lumbar region: Secondary | ICD-10-CM | POA: Diagnosis not present

## 2023-07-21 DIAGNOSIS — M542 Cervicalgia: Secondary | ICD-10-CM | POA: Diagnosis not present

## 2023-07-21 DIAGNOSIS — M5451 Vertebrogenic low back pain: Secondary | ICD-10-CM | POA: Diagnosis not present

## 2023-07-21 DIAGNOSIS — M9901 Segmental and somatic dysfunction of cervical region: Secondary | ICD-10-CM | POA: Diagnosis not present

## 2023-07-21 DIAGNOSIS — M9903 Segmental and somatic dysfunction of lumbar region: Secondary | ICD-10-CM | POA: Diagnosis not present

## 2023-07-23 DIAGNOSIS — M9901 Segmental and somatic dysfunction of cervical region: Secondary | ICD-10-CM | POA: Diagnosis not present

## 2023-07-23 DIAGNOSIS — M5451 Vertebrogenic low back pain: Secondary | ICD-10-CM | POA: Diagnosis not present

## 2023-07-23 DIAGNOSIS — M542 Cervicalgia: Secondary | ICD-10-CM | POA: Diagnosis not present

## 2023-07-23 DIAGNOSIS — M9903 Segmental and somatic dysfunction of lumbar region: Secondary | ICD-10-CM | POA: Diagnosis not present

## 2023-07-24 DIAGNOSIS — M9901 Segmental and somatic dysfunction of cervical region: Secondary | ICD-10-CM | POA: Diagnosis not present

## 2023-07-24 DIAGNOSIS — M542 Cervicalgia: Secondary | ICD-10-CM | POA: Diagnosis not present

## 2023-07-24 DIAGNOSIS — M5451 Vertebrogenic low back pain: Secondary | ICD-10-CM | POA: Diagnosis not present

## 2023-07-24 DIAGNOSIS — M9903 Segmental and somatic dysfunction of lumbar region: Secondary | ICD-10-CM | POA: Diagnosis not present

## 2023-07-28 DIAGNOSIS — M5451 Vertebrogenic low back pain: Secondary | ICD-10-CM | POA: Diagnosis not present

## 2023-07-28 DIAGNOSIS — M9901 Segmental and somatic dysfunction of cervical region: Secondary | ICD-10-CM | POA: Diagnosis not present

## 2023-07-28 DIAGNOSIS — M542 Cervicalgia: Secondary | ICD-10-CM | POA: Diagnosis not present

## 2023-07-28 DIAGNOSIS — M9903 Segmental and somatic dysfunction of lumbar region: Secondary | ICD-10-CM | POA: Diagnosis not present

## 2023-07-29 DIAGNOSIS — Z23 Encounter for immunization: Secondary | ICD-10-CM | POA: Diagnosis not present

## 2023-09-15 DIAGNOSIS — Z1211 Encounter for screening for malignant neoplasm of colon: Secondary | ICD-10-CM | POA: Diagnosis not present

## 2023-09-15 DIAGNOSIS — Z1331 Encounter for screening for depression: Secondary | ICD-10-CM | POA: Diagnosis not present

## 2023-09-15 DIAGNOSIS — E1169 Type 2 diabetes mellitus with other specified complication: Secondary | ICD-10-CM | POA: Diagnosis not present

## 2023-09-15 DIAGNOSIS — R972 Elevated prostate specific antigen [PSA]: Secondary | ICD-10-CM | POA: Diagnosis not present

## 2023-09-15 DIAGNOSIS — E782 Mixed hyperlipidemia: Secondary | ICD-10-CM | POA: Diagnosis not present

## 2023-09-15 DIAGNOSIS — J309 Allergic rhinitis, unspecified: Secondary | ICD-10-CM | POA: Diagnosis not present

## 2023-09-15 DIAGNOSIS — N529 Male erectile dysfunction, unspecified: Secondary | ICD-10-CM | POA: Diagnosis not present

## 2023-09-15 DIAGNOSIS — E559 Vitamin D deficiency, unspecified: Secondary | ICD-10-CM | POA: Diagnosis not present

## 2023-09-15 DIAGNOSIS — N400 Enlarged prostate without lower urinary tract symptoms: Secondary | ICD-10-CM | POA: Diagnosis not present

## 2023-09-15 DIAGNOSIS — Z0000 Encounter for general adult medical examination without abnormal findings: Secondary | ICD-10-CM | POA: Diagnosis not present

## 2023-09-15 DIAGNOSIS — M85851 Other specified disorders of bone density and structure, right thigh: Secondary | ICD-10-CM | POA: Diagnosis not present

## 2023-09-15 DIAGNOSIS — I10 Essential (primary) hypertension: Secondary | ICD-10-CM | POA: Diagnosis not present

## 2023-09-16 ENCOUNTER — Other Ambulatory Visit: Payer: Self-pay | Admitting: Family Medicine

## 2023-09-16 DIAGNOSIS — M85851 Other specified disorders of bone density and structure, right thigh: Secondary | ICD-10-CM

## 2023-09-17 DIAGNOSIS — Z23 Encounter for immunization: Secondary | ICD-10-CM | POA: Diagnosis not present

## 2023-09-25 DIAGNOSIS — N486 Induration penis plastica: Secondary | ICD-10-CM | POA: Diagnosis not present

## 2023-09-25 DIAGNOSIS — R3915 Urgency of urination: Secondary | ICD-10-CM | POA: Diagnosis not present

## 2023-09-25 DIAGNOSIS — R972 Elevated prostate specific antigen [PSA]: Secondary | ICD-10-CM | POA: Diagnosis not present

## 2023-09-25 DIAGNOSIS — N5201 Erectile dysfunction due to arterial insufficiency: Secondary | ICD-10-CM | POA: Diagnosis not present

## 2023-10-20 DIAGNOSIS — R0981 Nasal congestion: Secondary | ICD-10-CM | POA: Diagnosis not present

## 2023-10-20 DIAGNOSIS — R051 Acute cough: Secondary | ICD-10-CM | POA: Diagnosis not present

## 2023-10-20 DIAGNOSIS — J019 Acute sinusitis, unspecified: Secondary | ICD-10-CM | POA: Diagnosis not present

## 2023-11-04 DIAGNOSIS — H16142 Punctate keratitis, left eye: Secondary | ICD-10-CM | POA: Diagnosis not present

## 2023-11-04 DIAGNOSIS — H16041 Marginal corneal ulcer, right eye: Secondary | ICD-10-CM | POA: Diagnosis not present

## 2023-11-12 DIAGNOSIS — H16142 Punctate keratitis, left eye: Secondary | ICD-10-CM | POA: Diagnosis not present

## 2023-11-12 DIAGNOSIS — H16041 Marginal corneal ulcer, right eye: Secondary | ICD-10-CM | POA: Diagnosis not present

## 2023-11-18 DIAGNOSIS — H5202 Hypermetropia, left eye: Secondary | ICD-10-CM | POA: Diagnosis not present

## 2023-11-18 DIAGNOSIS — H524 Presbyopia: Secondary | ICD-10-CM | POA: Diagnosis not present

## 2023-11-18 DIAGNOSIS — H16223 Keratoconjunctivitis sicca, not specified as Sjogren's, bilateral: Secondary | ICD-10-CM | POA: Diagnosis not present

## 2023-11-18 DIAGNOSIS — H35373 Puckering of macula, bilateral: Secondary | ICD-10-CM | POA: Diagnosis not present

## 2023-11-18 DIAGNOSIS — H52222 Regular astigmatism, left eye: Secondary | ICD-10-CM | POA: Diagnosis not present

## 2023-12-09 ENCOUNTER — Encounter: Payer: Self-pay | Admitting: Family Medicine

## 2023-12-22 DIAGNOSIS — R972 Elevated prostate specific antigen [PSA]: Secondary | ICD-10-CM | POA: Diagnosis not present

## 2023-12-23 ENCOUNTER — Inpatient Hospital Stay: Payer: Medicare Other | Attending: Internal Medicine

## 2023-12-23 ENCOUNTER — Inpatient Hospital Stay (HOSPITAL_BASED_OUTPATIENT_CLINIC_OR_DEPARTMENT_OTHER): Payer: Medicare Other | Admitting: Internal Medicine

## 2023-12-23 VITALS — BP 127/68 | HR 66 | Temp 97.5°F | Resp 17 | Ht 66.0 in | Wt 171.1 lb

## 2023-12-23 DIAGNOSIS — Z79899 Other long term (current) drug therapy: Secondary | ICD-10-CM | POA: Diagnosis not present

## 2023-12-23 DIAGNOSIS — M8580 Other specified disorders of bone density and structure, unspecified site: Secondary | ICD-10-CM | POA: Insufficient documentation

## 2023-12-23 DIAGNOSIS — E559 Vitamin D deficiency, unspecified: Secondary | ICD-10-CM | POA: Insufficient documentation

## 2023-12-23 DIAGNOSIS — D72820 Lymphocytosis (symptomatic): Secondary | ICD-10-CM | POA: Diagnosis not present

## 2023-12-23 DIAGNOSIS — K219 Gastro-esophageal reflux disease without esophagitis: Secondary | ICD-10-CM | POA: Diagnosis not present

## 2023-12-23 DIAGNOSIS — I10 Essential (primary) hypertension: Secondary | ICD-10-CM | POA: Diagnosis not present

## 2023-12-23 DIAGNOSIS — E785 Hyperlipidemia, unspecified: Secondary | ICD-10-CM | POA: Insufficient documentation

## 2023-12-23 LAB — CBC WITH DIFFERENTIAL (CANCER CENTER ONLY)
Abs Immature Granulocytes: 0.03 10*3/uL (ref 0.00–0.07)
Basophils Absolute: 0.1 10*3/uL (ref 0.0–0.1)
Basophils Relative: 0 %
Eosinophils Absolute: 0.2 10*3/uL (ref 0.0–0.5)
Eosinophils Relative: 2 %
HCT: 46.3 % (ref 39.0–52.0)
Hemoglobin: 15.7 g/dL (ref 13.0–17.0)
Immature Granulocytes: 0 %
Lymphocytes Relative: 58 %
Lymphs Abs: 7.3 10*3/uL — ABNORMAL HIGH (ref 0.7–4.0)
MCH: 31.8 pg (ref 26.0–34.0)
MCHC: 33.9 g/dL (ref 30.0–36.0)
MCV: 93.9 fL (ref 80.0–100.0)
Monocytes Absolute: 0.8 10*3/uL (ref 0.1–1.0)
Monocytes Relative: 6 %
Neutro Abs: 4.3 10*3/uL (ref 1.7–7.7)
Neutrophils Relative %: 34 %
Platelet Count: 271 10*3/uL (ref 150–400)
RBC: 4.93 MIL/uL (ref 4.22–5.81)
RDW: 12.4 % (ref 11.5–15.5)
Smear Review: NORMAL
WBC Count: 12.6 10*3/uL — ABNORMAL HIGH (ref 4.0–10.5)
nRBC: 0 % (ref 0.0–0.2)

## 2023-12-23 LAB — CMP (CANCER CENTER ONLY)
ALT: 22 U/L (ref 0–44)
AST: 24 U/L (ref 15–41)
Albumin: 4.6 g/dL (ref 3.5–5.0)
Alkaline Phosphatase: 61 U/L (ref 38–126)
Anion gap: 5 (ref 5–15)
BUN: 19 mg/dL (ref 8–23)
CO2: 31 mmol/L (ref 22–32)
Calcium: 10.2 mg/dL (ref 8.9–10.3)
Chloride: 101 mmol/L (ref 98–111)
Creatinine: 1.08 mg/dL (ref 0.61–1.24)
GFR, Estimated: >60 mL/min (ref >60)
Glucose, Bld: 134 mg/dL — ABNORMAL HIGH (ref 70–99)
Potassium: 4.2 mmol/L (ref 3.5–5.1)
Sodium: 137 mmol/L (ref 135–145)
Total Bilirubin: 0.5 mg/dL (ref 0.0–1.2)
Total Protein: 8 g/dL (ref 6.5–8.1)

## 2023-12-23 LAB — LACTATE DEHYDROGENASE: LDH: 130 U/L (ref 98–192)

## 2023-12-23 NOTE — Progress Notes (Signed)
 [facility] [phone]   [phone]  OFFICE PROGRESS NOTE  [name], MD [phone] [address] [place] [address]  DIAGNOSIS: Lymphocytosis likely B cell lymphoproliferative disorder    PRIOR THERAPY: None   CURRENT THERAPY: Observation   INTERVAL HISTORY: Samuel Huang 73 y.o. male returns to the clinic today for 35-month follow-up visit.Discussed the use of AI scribe software for clinical note transcription with the patient, who gave verbal consent to proceed.  History of Present Illness   Samuel Huang is a 73 year old male with elevated lymphocyte count who presents for follow-up of possible B cell lymphoproliferative disorder.  He has a history of elevated lymphocyte count, which has been monitored over time. The absolute lymphocyte count has increased from 10,600 to 12,600 over the past visits, with the most recent count being 7,300. No significant weight loss, night sweats, or lymphadenopathy.  He feels great over the last six months with no significant issues. No night sweats, unintentional weight loss, chest pain, or breathing issues. He mentions a slight weight gain of two pounds since the last visit.  He experiences occasional bruising, attributing it to 'clumsiness' due to his active lifestyle. He recalls feeling some pressure under the rib cage while traveling, which he associates with dietary changes and gas, but this resolved on its own.  He and his wife have traveled extensively, visiting multiple countries including [place], [place], [place], and [place]. He plans to continue traveling, with upcoming trips to [place], [place], and [place]. His wife is involved in a business in [place], and he accompanies her on these trips.       MEDICAL HISTORY: Past Medical History:  Diagnosis Date   Anxiety    BPH (benign prostatic hyperplasia)    Cataract    Depression    work related/ retired   ED (erectile dysfunction)     GERD (gastroesophageal reflux disease)    Hemorrhoids    Hyperlipidemia    Hypertension    Insomnia    Osteopenia    Vitamin D deficiency     ALLERGIES:  is allergic to penicillins.  MEDICATIONS:  Current Outpatient Medications  Medication Sig Dispense Refill   amLODipine (NORVASC) 2.5 MG tablet Take 2.5 mg by mouth daily.     Ascorbic Acid (VITAMIN C) 500 MG CHEW Chew 500 mg by mouth daily.     carvedilol (COREG) 6.25 MG tablet Take 1 tablet by mouth 2 (two) times daily.     Cholecalciferol (VITAMIN D) 2000 units CAPS Take 1 capsule by mouth daily.      sildenafil (REVATIO) 20 MG tablet Take 20 mg by mouth as needed.     simvastatin (ZOCOR) 20 MG tablet Take 1 tablet by mouth daily.     telmisartan (MICARDIS) 80 MG tablet Take 80 mg by mouth daily.     No current facility-administered medications for this visit.    SURGICAL HISTORY:  Past Surgical History:  Procedure Laterality Date   COLONOSCOPY     FINGER FRACTURE SURGERY Left    Pinky finger    REVIEW OF SYSTEMS:  A comprehensive review of systems was negative.   PHYSICAL EXAMINATION: General appearance: alert, cooperative, and no distress Head: Normocephalic, without obvious abnormality, atraumatic Neck: no adenopathy, no JVD, supple, symmetrical, trachea midline, and thyroid not enlarged, symmetric, no tenderness/mass/nodules Lymph nodes: Cervical, supraclavicular, and axillary nodes normal. Resp: clear to auscultation bilaterally Back: symmetric, no curvature. ROM normal. No  CVA tenderness. Cardio: regular rate and rhythm, S1, S2 normal, no murmur, click, rub or gallop GI: soft, non-tender; bowel sounds normal; no masses,  no organomegaly Extremities: extremities normal, atraumatic, no cyanosis or edema  ECOG PERFORMANCE STATUS: 0 - Asymptomatic  Blood pressure 127/68, pulse 66, temperature (!) 97.5 F (36.4 C), temperature source Temporal, resp. rate 17, height 5\' 6"  (1.676 m), weight 171 lb 1.6 oz (77.6  kg), SpO2 100%.  LABORATORY DATA: Lab Results  Component Value Date   WBC 12.6 (H) 12/23/2023   HGB 15.7 12/23/2023   HCT 46.3 12/23/2023   MCV 93.9 12/23/2023   PLT 271 12/23/2023      Chemistry      Component Value Date/Time   NA 137 12/23/2023 1440   K 4.2 12/23/2023 1440   CL 101 12/23/2023 1440   CO2 31 12/23/2023 1440   BUN 19 12/23/2023 1440   CREATININE 1.08 12/23/2023 1440      Component Value Date/Time   CALCIUM 10.2 12/23/2023 1440   ALKPHOS 61 12/23/2023 1440   AST 24 12/23/2023 1440   ALT 22 12/23/2023 1440   BILITOT 0.5 12/23/2023 1440       RADIOGRAPHIC STUDIES: No results found.  ASSESSMENT AND PLAN: This is a very pleasant 73 years old white male with persistent lymphocytosis suspicious for underlying myeloproliferative disorder. The patient is currently on observation and he is feeling fine.    B-cell lymphoproliferative disorder (suspected Chronic Lymphocytic Leukemia - CLL) Elevated lymphocyte count consistent with a B-cell lymphoproliferative disorder, likely CLL. Absolute lymphocyte count has been gradually increasing (10.6, 11.1, 12.6). Asymptomatic with no significant weight loss, night sweats, lymphadenopathy, or splenomegaly. Emphasized that lymphocyte count alone is not concerning unless accompanied by symptoms. Discussed potential symptoms to monitor, including significant unintentional weight loss, night sweats, lymphadenopathy, and splenomegaly. - Monitor for symptoms such as significant unintentional weight loss, night sweats, lymphadenopathy, and splenomegaly. - Repeat blood work in six months. - Schedule follow-up appointment in six months.  General Health Maintenance Generally healthy and active, with recent travel to multiple countries. Slight weight gain noted, but actively managing weight. - Encourage continued healthy lifestyle and weight management.   The patient was advised to call immediately if he has any other concerning  symptoms in the interval. The patient voices understanding of current disease status and treatment options and is in agreement with the current care plan.  All questions were answered. The patient knows to call the clinic with any problems, questions or concerns. We can certainly see the patient much sooner if necessary.  The total time spent in the appointment was 20 minutes.  Disclaimer: This note was dictated with voice recognition software. Similar sounding words can inadvertently be transcribed and may not be corrected upon review.

## 2023-12-29 DIAGNOSIS — R3915 Urgency of urination: Secondary | ICD-10-CM | POA: Diagnosis not present

## 2023-12-29 DIAGNOSIS — N486 Induration penis plastica: Secondary | ICD-10-CM | POA: Diagnosis not present

## 2023-12-29 DIAGNOSIS — R972 Elevated prostate specific antigen [PSA]: Secondary | ICD-10-CM | POA: Diagnosis not present

## 2023-12-29 DIAGNOSIS — N5201 Erectile dysfunction due to arterial insufficiency: Secondary | ICD-10-CM | POA: Diagnosis not present

## 2024-02-17 DIAGNOSIS — R3915 Urgency of urination: Secondary | ICD-10-CM | POA: Diagnosis not present

## 2024-02-17 DIAGNOSIS — R972 Elevated prostate specific antigen [PSA]: Secondary | ICD-10-CM | POA: Diagnosis not present

## 2024-02-20 ENCOUNTER — Emergency Department (HOSPITAL_COMMUNITY)

## 2024-02-20 ENCOUNTER — Inpatient Hospital Stay (HOSPITAL_COMMUNITY)
Admission: EM | Admit: 2024-02-20 | Discharge: 2024-02-23 | DRG: 872 | Disposition: A | Discharge status: Home / Self Care | Attending: Internal Medicine | Admitting: Internal Medicine

## 2024-02-20 ENCOUNTER — Other Ambulatory Visit: Payer: Self-pay

## 2024-02-20 ENCOUNTER — Encounter (HOSPITAL_COMMUNITY): Payer: Self-pay | Admitting: Internal Medicine

## 2024-02-20 DIAGNOSIS — I10 Essential (primary) hypertension: Secondary | ICD-10-CM | POA: Diagnosis present

## 2024-02-20 DIAGNOSIS — Z87891 Personal history of nicotine dependence: Secondary | ICD-10-CM | POA: Diagnosis not present

## 2024-02-20 DIAGNOSIS — N3000 Acute cystitis without hematuria: Secondary | ICD-10-CM | POA: Diagnosis not present

## 2024-02-20 DIAGNOSIS — E559 Vitamin D deficiency, unspecified: Secondary | ICD-10-CM | POA: Insufficient documentation

## 2024-02-20 DIAGNOSIS — A419 Sepsis, unspecified organism: Principal | ICD-10-CM | POA: Diagnosis present

## 2024-02-20 DIAGNOSIS — Z880 Allergy status to penicillin: Secondary | ICD-10-CM | POA: Diagnosis not present

## 2024-02-20 DIAGNOSIS — E782 Mixed hyperlipidemia: Secondary | ICD-10-CM | POA: Diagnosis present

## 2024-02-20 DIAGNOSIS — E872 Acidosis, unspecified: Secondary | ICD-10-CM | POA: Diagnosis present

## 2024-02-20 DIAGNOSIS — Z79899 Other long term (current) drug therapy: Secondary | ICD-10-CM | POA: Diagnosis not present

## 2024-02-20 DIAGNOSIS — Z8249 Family history of ischemic heart disease and other diseases of the circulatory system: Secondary | ICD-10-CM | POA: Diagnosis not present

## 2024-02-20 DIAGNOSIS — N3001 Acute cystitis with hematuria: Secondary | ICD-10-CM | POA: Diagnosis present

## 2024-02-20 DIAGNOSIS — E871 Hypo-osmolality and hyponatremia: Principal | ICD-10-CM | POA: Diagnosis present

## 2024-02-20 DIAGNOSIS — N390 Urinary tract infection, site not specified: Secondary | ICD-10-CM | POA: Diagnosis not present

## 2024-02-20 DIAGNOSIS — B9620 Unspecified Escherichia coli [E. coli] as the cause of diseases classified elsewhere: Secondary | ICD-10-CM | POA: Diagnosis present

## 2024-02-20 DIAGNOSIS — N529 Male erectile dysfunction, unspecified: Secondary | ICD-10-CM | POA: Diagnosis present

## 2024-02-20 DIAGNOSIS — N400 Enlarged prostate without lower urinary tract symptoms: Secondary | ICD-10-CM | POA: Diagnosis present

## 2024-02-20 DIAGNOSIS — K219 Gastro-esophageal reflux disease without esophagitis: Secondary | ICD-10-CM | POA: Diagnosis present

## 2024-02-20 DIAGNOSIS — R7989 Other specified abnormal findings of blood chemistry: Secondary | ICD-10-CM

## 2024-02-20 DIAGNOSIS — Z1612 Extended spectrum beta lactamase (ESBL) resistance: Secondary | ICD-10-CM | POA: Diagnosis present

## 2024-02-20 DIAGNOSIS — R7402 Elevation of levels of lactic acid dehydrogenase (LDH): Secondary | ICD-10-CM | POA: Diagnosis not present

## 2024-02-20 DIAGNOSIS — Z0389 Encounter for observation for other suspected diseases and conditions ruled out: Secondary | ICD-10-CM | POA: Diagnosis not present

## 2024-02-20 LAB — URINALYSIS, W/ REFLEX TO CULTURE (INFECTION SUSPECTED)
Bilirubin Urine: NEGATIVE
Glucose, UA: >=500 mg/dL — AB
Ketones, ur: NEGATIVE mg/dL
Nitrite: NEGATIVE
Protein, ur: 100 mg/dL — AB
RBC / HPF: >50 RBC/hpf (ref 0–5)
Specific Gravity, Urine: 1.012 (ref 1.005–1.030)
WBC, UA: >50 WBC/hpf (ref 0–5)
pH: 5 (ref 5.0–8.0)

## 2024-02-20 LAB — CBC WITH DIFFERENTIAL/PLATELET
Abs Immature Granulocytes: 0.05 10*3/uL (ref 0.00–0.07)
Basophils Absolute: 0.1 10*3/uL (ref 0.0–0.1)
Basophils Relative: 0 %
Eosinophils Absolute: 0 10*3/uL (ref 0.0–0.5)
Eosinophils Relative: 0 %
HCT: 44.1 % (ref 39.0–52.0)
Hemoglobin: 14.5 g/dL (ref 13.0–17.0)
Immature Granulocytes: 0 %
Lymphocytes Relative: 30 %
Lymphs Abs: 4.7 10*3/uL — ABNORMAL HIGH (ref 0.7–4.0)
MCH: 31.4 pg (ref 26.0–34.0)
MCHC: 32.9 g/dL (ref 30.0–36.0)
MCV: 95.5 fL (ref 80.0–100.0)
Monocytes Absolute: 1.3 10*3/uL — ABNORMAL HIGH (ref 0.1–1.0)
Monocytes Relative: 9 %
Neutro Abs: 9.3 10*3/uL — ABNORMAL HIGH (ref 1.7–7.7)
Neutrophils Relative %: 61 %
Platelets: 207 10*3/uL (ref 150–400)
RBC: 4.62 MIL/uL (ref 4.22–5.81)
RDW: 12.2 % (ref 11.5–15.5)
WBC: 15.5 10*3/uL — ABNORMAL HIGH (ref 4.0–10.5)
nRBC: 0 % (ref 0.0–0.2)

## 2024-02-20 LAB — COMPREHENSIVE METABOLIC PANEL WITH GFR
ALT: 25 U/L (ref 0–44)
AST: 25 U/L (ref 15–41)
Albumin: 3.7 g/dL (ref 3.5–5.0)
Alkaline Phosphatase: 46 U/L (ref 38–126)
Anion gap: 9 (ref 5–15)
BUN: 17 mg/dL (ref 8–23)
CO2: 23 mmol/L (ref 22–32)
Calcium: 9.4 mg/dL (ref 8.9–10.3)
Chloride: 98 mmol/L (ref 98–111)
Creatinine, Ser: 1.24 mg/dL (ref 0.61–1.24)
GFR, Estimated: >60 mL/min (ref >60)
Glucose, Bld: 210 mg/dL — ABNORMAL HIGH (ref 70–99)
Potassium: 4 mmol/L (ref 3.5–5.1)
Sodium: 130 mmol/L — ABNORMAL LOW (ref 135–145)
Total Bilirubin: 0.9 mg/dL (ref 0.0–1.2)
Total Protein: 7 g/dL (ref 6.5–8.1)

## 2024-02-20 LAB — I-STAT CG4 LACTIC ACID, ED
Lactic Acid, Venous: 2.4 mmol/L (ref 0.5–1.9)
Lactic Acid, Venous: 1.9 mmol/L (ref 0.5–1.9)

## 2024-02-20 LAB — GLUCOSE, CAPILLARY: Glucose-Capillary: 132 mg/dL — ABNORMAL HIGH (ref 70–99)

## 2024-02-20 MED ORDER — ACETAMINOPHEN 325 MG PO TABS
650.0000 mg | ORAL_TABLET | Freq: Four times a day (QID) | ORAL | Status: DC | PRN
  Administered 2024-02-20: 650 mg via ORAL
  Filled 2024-02-20: qty 2

## 2024-02-20 MED ORDER — LACTATED RINGERS IV BOLUS
1000.0000 mL | Freq: Once | INTRAVENOUS | Status: AC
  Administered 2024-02-20: 1000 mL via INTRAVENOUS

## 2024-02-20 MED ORDER — CEFEPIME HCL 2 G IV SOLR
2.0000 g | Freq: Three times a day (TID) | INTRAVENOUS | Status: DC
  Administered 2024-02-20 – 2024-02-22 (×6): 2 g via INTRAVENOUS
  Filled 2024-02-20 (×6): qty 12.5

## 2024-02-20 MED ORDER — SIMETHICONE 80 MG PO CHEW
80.0000 mg | CHEWABLE_TABLET | Freq: Four times a day (QID) | ORAL | Status: DC | PRN
  Administered 2024-02-20 – 2024-02-22 (×2): 80 mg via ORAL
  Filled 2024-02-20 (×2): qty 1

## 2024-02-20 MED ORDER — ACETAMINOPHEN 650 MG RE SUPP: 650.0000 mg | Freq: Four times a day (QID) | RECTAL | Status: DC | PRN

## 2024-02-20 MED ORDER — ONDANSETRON HCL 4 MG PO TABS: 4.0000 mg | ORAL_TABLET | Freq: Four times a day (QID) | ORAL | Status: DC | PRN

## 2024-02-20 MED ORDER — CARVEDILOL 3.125 MG PO TABS
6.2500 mg | ORAL_TABLET | Freq: Two times a day (BID) | ORAL | Status: DC
  Administered 2024-02-20 – 2024-02-23 (×6): 6.25 mg via ORAL
  Filled 2024-02-20 (×6): qty 2

## 2024-02-20 MED ORDER — ONDANSETRON HCL 4 MG/2ML IJ SOLN: 4.0000 mg | Freq: Four times a day (QID) | INTRAMUSCULAR | Status: DC | PRN

## 2024-02-20 MED ORDER — LOSARTAN POTASSIUM 50 MG PO TABS
100.0000 mg | ORAL_TABLET | Freq: Every day | ORAL | Status: DC
  Administered 2024-02-21 – 2024-02-23 (×3): 100 mg via ORAL
  Filled 2024-02-20 (×3): qty 2

## 2024-02-20 MED ORDER — SODIUM CHLORIDE 0.9 % IV SOLN: INTRAVENOUS | Status: AC

## 2024-02-20 MED ORDER — POLYETHYLENE GLYCOL 3350 17 G PO PACK: 17.0000 g | PACK | Freq: Every day | ORAL | Status: DC | PRN

## 2024-02-20 MED ORDER — SIMVASTATIN 20 MG PO TABS
20.0000 mg | ORAL_TABLET | Freq: Every day | ORAL | Status: DC
  Administered 2024-02-20 – 2024-02-22 (×3): 20 mg via ORAL
  Filled 2024-02-20 (×3): qty 1

## 2024-02-20 MED ORDER — SODIUM CHLORIDE 0.9 % IV SOLN
1.0000 g | Freq: Once | INTRAVENOUS | Status: AC
  Administered 2024-02-20: 1 g via INTRAVENOUS
  Filled 2024-02-20: qty 10

## 2024-02-20 MED ORDER — LACTATED RINGERS IV BOLUS (SEPSIS)
1000.0000 mL | Freq: Once | INTRAVENOUS | Status: AC
  Administered 2024-02-20: 1000 mL via INTRAVENOUS

## 2024-02-20 MED ORDER — AMLODIPINE BESYLATE 5 MG PO TABS
2.5000 mg | ORAL_TABLET | Freq: Every day | ORAL | Status: DC
  Administered 2024-02-20 – 2024-02-22 (×3): 2.5 mg via ORAL
  Filled 2024-02-20 (×3): qty 1

## 2024-02-20 NOTE — ED Triage Notes (Signed)
 Pt reports prostate biopsy on Tuesday. Reports fever that started on Wednesday. Provider started pt on Levaquin yesterday. Pt running fever again this morning and was told to come to the ER.

## 2024-02-20 NOTE — H&P (Signed)
 History and Physical    Patient: Samuel Huang ZOX:[id number] DOB: 10/13/1951 DOA: 02/20/2024 DOS: the patient was seen and examined on 02/20/2024 PCP: Rae Bugler, MD  Patient coming from: Home  Chief Complaint:  Chief Complaint  Patient presents with   Fever   HPI: Samuel Huang is a 73 y.o. male with medical history significant of anxiety, depression, cataracts, hemorrhoids, hyperlipidemia, hypertension, insomnia, osteopenia, vitamin D deficiency, erectile dysfunction, BPH who underwent a prostate biopsy on Tuesday, then started having some hematuria that has already clear, but also developed fever on Wednesday.  Despite therapy with oral levofloxacin he has continued to have symptoms and had a fever this morning so he decided to come to the emergency department.  No flank pain.  He denied  rhinorrhea, sore throat, wheezing or hemoptysis.  No chest pain, palpitations, diaphoresis, PND, orthopnea or pitting edema of the lower extremities.  No abdominal pain, nausea, emesis, diarrhea, constipation, melena or hematochezia.  No polyuria, polydipsia, polyphagia or blurred vision.   Lab work: Urinalysis cloudy, glucose is greater than 500, large hemoglobin, large leukocyte esterase, protein 100 mg/dL, RBC greater than 50, WBC greater than 50, few bacteria and positive WBC clumps.  CBC showed a white count of 15.5 with 61% neutrophils, hemoglobin 14.5 g/dL and platelets 811.  Lactic acid 2.4 then 1.9 mmol/L.  CMP showed a glucose of 210 mg/dL, sodium normalizes after correction.  The rest of the CMP measurements were normal.  Imaging: Portable 1 view chest radiograph with no acute cardiopulmonary abnormality.  ED course: Initial vital signs were temperature 97.9 F, pulse 84, respiration 16, BP 135/71 mmHg O2 sat 98% on room air.  The patient received ceftriaxone  1 g IVPB and 2000 mL of LR bolus.   Review of Systems: As mentioned in the history of present illness. All other systems reviewed and  are negative.  Past Medical History:  Diagnosis Date   Anxiety    BPH (benign prostatic hyperplasia)    Cataract    Depression    work related/ retired   ED (erectile dysfunction)    GERD (gastroesophageal reflux disease)    Hemorrhoids    Hyperlipidemia    Hypertension    Insomnia    Osteopenia    Vitamin D deficiency    Past Surgical History:  Procedure Laterality Date   COLONOSCOPY     FINGER FRACTURE SURGERY Left    Pinky finger   Social History:  reports that he quit smoking about 45 years ago. His smoking use included cigarettes. He started smoking about 48 years ago. He has a 0.9 pack-year smoking history. He has never used smokeless tobacco. He reports current alcohol use. He reports that he does not use drugs.  Allergies  Allergen Reactions   Penicillins     REACTION: rash    Family History  Problem Relation Age of Onset   Heart disease Mother    Diabetes Father    Diabetes Sister    Colon cancer Neg Hx    Stomach cancer Neg Hx    Rectal cancer Neg Hx    Esophageal cancer Neg Hx    Liver cancer Neg Hx     Prior to Admission medications   Medication Sig Start Date End Date Taking? Authorizing Provider  amLODipine  (NORVASC ) 2.5 MG tablet Take 2.5 mg by mouth daily. 07/27/20   [provider]  Ascorbic Acid (VITAMIN C) 500 MG CHEW Chew 500 mg by mouth daily.    [provider]  carvedilol  (COREG ) 6.25 MG tablet Take 1 tablet by mouth 2 (two) times daily.    [provider]  Cholecalciferol (VITAMIN D) 2000 units CAPS Take 1 capsule by mouth daily.     [provider]  sildenafil (REVATIO) 20 MG tablet Take 20 mg by mouth as needed. 06/11/19   [provider]  simvastatin  (ZOCOR ) 20 MG tablet Take 1 tablet by mouth daily. 01/29/12   [provider]  telmisartan (MICARDIS) 80 MG tablet Take 80 mg by mouth daily. 02/25/23   [provider]    Physical Exam: Vitals:   02/20/24 0927 02/20/24 0945  02/20/24 1000 02/20/24 1100  BP: 135/71 122/68 121/74 114/66  Pulse: 84 83 83 79  Resp: 16 16  16   Temp: 97.9 F (36.6 C)     TempSrc: Oral     SpO2: 98% 96% 97% 96%   Physical Exam Vitals and nursing note reviewed.  HENT:     Mouth/Throat:     Mouth: Mucous membranes are dry.  Eyes:     General: No scleral icterus.    Pupils: Pupils are equal, round, and reactive to light.  Cardiovascular:     Rate and Rhythm: Normal rate and regular rhythm.  Pulmonary:     Effort: Pulmonary effort is normal.     Breath sounds: Normal breath sounds. No wheezing, rhonchi or rales.  Abdominal:     General: Bowel sounds are normal. There is no distension.     Palpations: Abdomen is soft.     Tenderness: There is no abdominal tenderness.  Musculoskeletal:     Cervical back: Neck supple.     Right lower leg: No edema.     Left lower leg: No edema.  Skin:    General: Skin is warm and dry.  Neurological:     General: No focal deficit present.     Mental Status: He is alert and oriented to person, place, and time.  Psychiatric:        Mood and Affect: Mood normal.        Behavior: Behavior normal.    Data Reviewed:  Results are pending, will review when available.  Assessment and Plan: Principal Problem:   BPH (benign prostatic hyperplasia) Status post prostate biopsy earlier this week. Did not respond to oral levofloxacin.   Sepsis secondary to UTI [facility]) Admit to MedSurg/inpatient. Continue IV fluids. Continue cefepime  2 g every 8 hours.  . Follow-up blood culture and sensitivity. Follow-up urine culture and sensitivity. Follow CBC and CMP in a.m. Briefly discussed with Dr. Secundino Dach. No previous cultures available at his office. Recommend broad-spectrum cephalosporin or Zosyn.  Active Problems:   Hypertension Continue amlodipine  2.5 mg p.o. bedtime. Continue carvedilol  6.25 p.o. twice daily. Continue losartan 100 mg p.o. daily.    Mixed hyperlipidemia Continue simvastatin  20  mg p.o. bedtime.    Advance Care Planning:   Code Status: Full Code   Consults:   Family Communication: His wife was at bedside.  Severity of Illness: The appropriate patient status for this patient is INPATIENT. Inpatient status is judged to be reasonable and necessary in order to provide the required intensity of service to ensure the patient's safety. The patient's presenting symptoms, physical exam findings, and initial radiographic and laboratory data in the context of their chronic comorbidities is felt to place them at high risk for further clinical deterioration. Furthermore, it is not anticipated that the patient will be medically stable for discharge from the hospital within 2  midnights of admission.   * I certify that at the point of admission it is my clinical judgment that the patient will require inpatient hospital care spanning beyond 2 midnights from the point of admission due to high intensity of service, high risk for further deterioration and high frequency of surveillance required.*  Author: Danice Dural, MD 02/20/2024 11:42 AM  For on call review [email or web address].   This document was prepared using Dragon voice recognition software and may contain some unintended transcription errors.

## 2024-02-20 NOTE — ED Notes (Signed)
 ED TO INPATIENT HANDOFF REPORT  ED Nurse Name and Phone #: Murleen Arms [phone]  S Name/Age/Gender Samuel Huang 73 y.o. male Room/Bed: WA07/WA07  Code Status   Code Status: Full Code  Home/SNF/Other Home Patient oriented to: self, place, time, and situation Is this baseline? Yes   Triage Complete: Triage complete  Chief Complaint Sepsis secondary to UTI (HCC) [A41.9, N39.0]  Triage Note Pt reports prostate biopsy on Tuesday. Reports fever that started on Wednesday. Provider started pt on Levaquin yesterday. Pt running fever again this morning and was told to come to the ER.    Allergies Allergies  Allergen Reactions   Penicillins Rash    Level of Care/Admitting Diagnosis ED Disposition     ED Disposition  Admit   Condition  --   Comment  Hospital [facility]: [facility] [[id number]]  Level of Care: Med-Surg [16]  May admit patient to [facility] or [facility] Sm[facility] if equivalent level of care is available:: No  Covid Evaluation: Asymptomatic - no recent exposure (last 10 days) testing not required  Diagnosis: Sepsis secondary to UTI [facility]) [[id number]]  Admitting Physician: Danice Dural [[id number]]  Attending Physician: Danice Dural [[id number]]  Certification:: I certify this patient will need inpatient services for at least 2 midnights  Expected Medical Readiness: 02/22/2024          B Medical/Surgery History Past Medical History:  Diagnosis Date   Anxiety    BPH (benign prostatic hyperplasia)    Cataract    Depression    work related/ retired   ED (erectile dysfunction)    GERD (gastroesophageal reflux disease)    Hemorrhoids    Hyperlipidemia    Hypertension    Insomnia    Osteopenia    Vitamin D deficiency    Past Surgical History:  Procedure Laterality Date   COLONOSCOPY     FINGER FRACTURE SURGERY Left    Pinky finger     A IV Location/Drains/Wounds Patient Lines/Drains/Airways Status     Active Line/Drains/Airways      Name Placement date Placement time Site Days   Peripheral IV 02/20/24 20 G Anterior;Distal;Right;Upper Arm 02/20/24  0955  Arm  less than 1   Peripheral IV 02/20/24 20 G Posterior;Right Forearm 02/20/24  1005  Forearm  less than 1            Intake/Output Last 24 hours No intake or output data in the 24 hours ending 02/20/24 1307  Labs/Imaging Results for orders placed or performed during the hospital encounter of 02/20/24 (from the past 48 hours)  Comprehensive metabolic panel     Status: Abnormal   Collection Time: 02/20/24 10:01 AM  Result Value Ref Range   Sodium 130 (L) 135 - 145 mmol/L   Potassium 4.0 3.5 - 5.1 mmol/L   Chloride 98 98 - 111 mmol/L   CO2 23 22 - 32 mmol/L   Glucose, Bld 210 (H) 70 - 99 mg/dL    Comment: Glucose reference range applies only to samples taken after fasting for at least 8 hours.   BUN 17 8 - 23 mg/dL   Creatinine, Ser 0.86 0.61 - 1.24 mg/dL   Calcium 9.4 8.9 - 57.8 mg/dL   Total Protein 7.0 6.5 - 8.1 g/dL   Albumin 3.7 3.5 - 5.0 g/dL   AST 25 15 - 41 U/L   ALT 25 0 - 44 U/L   Alkaline Phosphatase 46 38 - 126 U/L   Total Bilirubin 0.9 0.0 -  1.2 mg/dL   GFR, Estimated >54 >09 mL/min    Comment: (NOTE) Calculated using the CKD-EPI Creatinine Equation (2021)    Anion gap 9 5 - 15    Comment: Performed at [facility], [address], [place], [place] [address]  CBC with Differential     Status: Abnormal   Collection Time: 02/20/24 10:01 AM  Result Value Ref Range   WBC 15.5 (H) 4.0 - 10.5 K/uL   RBC 4.62 4.22 - 5.81 MIL/uL   Hemoglobin 14.5 13.0 - 17.0 g/dL   HCT 47.8 29.5 - 62.1 %   MCV 95.5 80.0 - 100.0 fL   MCH 31.4 26.0 - 34.0 pg   MCHC 32.9 30.0 - 36.0 g/dL   RDW 30.8 65.7 - 84.6 %   Platelets 207 150 - 400 K/uL   nRBC 0.0 0.0 - 0.2 %   Neutrophils Relative % 61 %   Neutro Abs 9.3 (H) 1.7 - 7.7 K/uL   Lymphocytes Relative 30 %   Lymphs Abs 4.7 (H) 0.7 - 4.0 K/uL   Monocytes Relative 9 %   Monocytes  Absolute 1.3 (H) 0.1 - 1.0 K/uL   Eosinophils Relative 0 %   Eosinophils Absolute 0.0 0.0 - 0.5 K/uL   Basophils Relative 0 %   Basophils Absolute 0.1 0.0 - 0.1 K/uL   Immature Granulocytes 0 %   Abs Immature Granulocytes 0.05 0.00 - 0.07 K/uL    Comment: Performed at [facility], [address], [place], [place] [address]  I-Stat Lactic Acid, ED     Status: Abnormal   Collection Time: 02/20/24 10:05 AM  Result Value Ref Range   Lactic Acid, Venous 2.4 (HH) 0.5 - 1.9 mmol/L   Comment NOTIFIED PHYSICIAN   Urinalysis, w/ Reflex to Culture (Infection Suspected) -Urine, Clean Catch     Status: Abnormal   Collection Time: 02/20/24 10:51 AM  Result Value Ref Range   Specimen Source URINE, CLEAN CATCH    Color, Urine YELLOW YELLOW   APPearance CLOUDY (A) CLEAR   Specific Gravity, Urine 1.012 1.005 - 1.030   pH 5.0 5.0 - 8.0   Glucose, UA >=500 (A) NEGATIVE mg/dL   Hgb urine dipstick LARGE (A) NEGATIVE   Bilirubin Urine NEGATIVE NEGATIVE   Ketones, ur NEGATIVE NEGATIVE mg/dL   Protein, ur 284 (A) NEGATIVE mg/dL   Nitrite NEGATIVE NEGATIVE   Leukocytes,Ua LARGE (A) NEGATIVE   RBC / HPF >50 0 - 5 RBC/hpf   WBC, UA >50 0 - 5 WBC/hpf    Comment:        Reflex urine culture not performed if WBC <=10, OR if Squamous epithelial cells >5. If Squamous epithelial cells >5 suggest recollection.    Bacteria, UA FEW (A) NONE SEEN   Squamous Epithelial / HPF 0-5 0 - 5 /HPF   WBC Clumps PRESENT    Mucus PRESENT     Comment: Performed at [facility], [address], [place], [place] [address]  I-Stat Lactic Acid, ED     Status: None   Collection Time: 02/20/24 12:29 PM  Result Value Ref Range   Lactic Acid, Venous 1.9 0.5 - 1.9 mmol/L   DG Chest Port 1 View Result Date: 02/20/2024 CLINICAL DATA:  Questionable sepsis - evaluate for abnormality EXAM: PORTABLE CHEST - 1 VIEW COMPARISON:  January 31, 2015 FINDINGS: No focal airspace consolidation, pleural  effusion, or pneumothorax. No cardiomegaly. No acute fracture or destructive lesion. Multilevel thoracic osteophytosis. IMPRESSION:  No acute cardiopulmonary abnormality. Electronically Signed   By: Rance Burrows M.D.   On: 02/20/2024 11:40    Pending Labs Unresulted Labs (From admission, onward)     Start     Ordered   02/21/24 0500  CBC with Differential  Tomorrow morning,   R        02/20/24 1147   02/21/24 0500  Comprehensive metabolic panel  Tomorrow morning,   R        02/20/24 1147   02/20/24 1051  Urine Culture  Once,   R        02/20/24 1051   02/20/24 0950  Blood Culture (routine x 2)  (Undifferentiated presentation (screening labs and basic nursing orders))  BLOOD CULTURE X 2,   STAT      02/20/24 0950            Vitals/Pain Today's Vitals   02/20/24 0931 02/20/24 0945 02/20/24 1000 02/20/24 1100  BP:  122/68 121/74 114/66  Pulse:  83 83 79  Resp:  16  16  Temp:      TempSrc:      SpO2:  96% 97% 96%  PainSc: 0-No pain       Isolation Precautions No active isolations  Medications Medications  lactated ringers bolus 1,000 mL (1,000 mLs Intravenous Bolus 02/20/24 1221)  ceFEPIme  (MAXIPIME ) 2 g in sodium chloride  0.9 % 100 mL IVPB (has no administration in time range)  acetaminophen  (TYLENOL ) tablet 650 mg (has no administration in time range)    Or  acetaminophen  (TYLENOL ) suppository 650 mg (has no administration in time range)  ondansetron (ZOFRAN) tablet 4 mg (has no administration in time range)    Or  ondansetron (ZOFRAN) injection 4 mg (has no administration in time range)  simvastatin  (ZOCOR ) tablet 20 mg (has no administration in time range)  carvedilol  (COREG ) tablet 6.25 mg (has no administration in time range)  amLODipine  (NORVASC ) tablet 2.5 mg (has no administration in time range)  losartan (COZAAR) tablet 100 mg (has no administration in time range)  cefTRIAXone  (ROCEPHIN ) 1 g in sodium chloride  0.9 % 100 mL IVPB (0 g Intravenous Stopped 02/20/24  1152)  lactated ringers bolus 1,000 mL (1,000 mLs Intravenous Bolus 02/20/24 1221)    Mobility walks     Focused Assessments    R Recommendations: See Admitting Provider Note  Report given to:   Additional Notes:

## 2024-02-20 NOTE — ED Notes (Signed)
 I stat Lactic Acid was given to RN and MD.

## 2024-02-20 NOTE — Plan of Care (Signed)
  Problem: Education: Goal: Knowledge of General Education information will improve Description: Including pain rating scale, medication(s)/side effects and non-pharmacologic comfort measures Outcome: Progressing   Problem: Clinical Measurements: Goal: Will remain free from infection Outcome: Progressing   Problem: Activity: Goal: Risk for activity intolerance will decrease Outcome: Progressing   Problem: Nutrition: Goal: Adequate nutrition will be maintained Outcome: Progressing   Problem: Coping: Goal: Level of anxiety will decrease Outcome: Progressing   Problem: Elimination: Goal: Will not experience complications related to bowel motility Outcome: Progressing Goal: Will not experience complications related to urinary retention Outcome: Progressing   Problem: Pain Managment: Goal: General experience of comfort will improve and/or be controlled Outcome: Progressing   Problem: Safety: Goal: Ability to remain free from injury will improve Outcome: Progressing

## 2024-02-20 NOTE — ED Provider Notes (Signed)
 [place] [facility] AT [facility] Provider Note   CSN: [id number] Arrival date & time: 02/20/24  2725     History  Chief Complaint  Patient presents with  . Fever    Samuel Huang is a 73 y.o. male.   Fever    Patient has a history of hypertension acid reflux hyperlipidemia BPH.  Patient states he had a prostate biopsy on Tuesday.  Patient states he had a fever up to 101 yesterday.  He had a fever up to 102 this morning.  He was started on Levaquin by his urologist yesterday.  With his persistent fevers he was instructed to come to the ED for further evaluation.  Patient states he had some urgency initially after the procedure but otherwise has been urinating normally now.  He is not having abdominal pain any diarrhea or any vomiting.  Any rashes.  No coughing.  Home Medications Prior to Admission medications   Medication Sig Start Date End Date Taking? Authorizing Provider  amLODipine  (NORVASC ) 2.5 MG tablet Take 2.5 mg by mouth daily. 07/27/20   [provider]  Ascorbic Acid (VITAMIN C) 500 MG CHEW Chew 500 mg by mouth daily.    [provider]  carvedilol  (COREG ) 6.25 MG tablet Take 1 tablet by mouth 2 (two) times daily.    [provider]  Cholecalciferol (VITAMIN D) 2000 units CAPS Take 1 capsule by mouth daily.     [provider]  sildenafil (REVATIO) 20 MG tablet Take 20 mg by mouth as needed. 06/11/19   [provider]  simvastatin  (ZOCOR ) 20 MG tablet Take 1 tablet by mouth daily. 01/29/12   [provider]  telmisartan (MICARDIS) 80 MG tablet Take 80 mg by mouth daily. 02/25/23   [provider]      Allergies    Penicillins    Review of Systems   Review of Systems  Constitutional:  Positive for fever.    Physical Exam Updated Vital Signs BP 114/66   Pulse 79   Temp 97.9 F (36.6 C) (Oral)   Resp 16   SpO2 96%  Physical Exam Vitals and nursing note reviewed.   Constitutional:      General: He is not in acute distress.    Appearance: He is well-developed.  HENT:     Head: Normocephalic and atraumatic.     Right Ear: External ear normal.     Left Ear: External ear normal.  Eyes:     General: No scleral icterus.       Right eye: No discharge.        Left eye: No discharge.     Conjunctiva/sclera: Conjunctivae normal.  Neck:     Trachea: No tracheal deviation.  Cardiovascular:     Rate and Rhythm: Normal rate and regular rhythm.  Pulmonary:     Effort: Pulmonary effort is normal. No respiratory distress.     Breath sounds: Normal breath sounds. No stridor. No wheezing or rales.  Abdominal:     General: Bowel sounds are normal. There is no distension.     Palpations: Abdomen is soft.     Tenderness: There is no abdominal tenderness. There is no guarding or rebound.  Musculoskeletal:        General: No tenderness or deformity.     Cervical back: Neck supple.  Skin:    General: Skin is warm and dry.     Findings: No rash.  Neurological:     General:  No focal deficit present.     Mental Status: He is alert.     Cranial Nerves: No cranial nerve deficit, dysarthria or facial asymmetry.     Sensory: No sensory deficit.     Motor: No abnormal muscle tone or seizure activity.     Coordination: Coordination normal.  Psychiatric:        Mood and Affect: Mood normal.     ED Results / Procedures / Treatments   Labs (all labs ordered are listed, but only abnormal results are displayed) Labs Reviewed  COMPREHENSIVE METABOLIC PANEL WITH GFR - Abnormal; Notable for the following components:      Result Value   Sodium 130 (*)    Glucose, Bld 210 (*)    All other components within normal limits  CBC WITH DIFFERENTIAL/PLATELET - Abnormal; Notable for the following components:   WBC 15.5 (*)    Neutro Abs 9.3 (*)    Lymphs Abs 4.7 (*)    Monocytes Absolute 1.3 (*)    All other components within normal limits  URINALYSIS, W/ REFLEX TO  CULTURE (INFECTION SUSPECTED) - Abnormal; Notable for the following components:   APPearance CLOUDY (*)    Glucose, UA >=500 (*)    Hgb urine dipstick LARGE (*)    Protein, ur 100 (*)    Leukocytes,Ua LARGE (*)    Bacteria, UA FEW (*)    All other components within normal limits  I-STAT CG4 LACTIC ACID, ED - Abnormal; Notable for the following components:   Lactic Acid, Venous 2.4 (*)    All other components within normal limits  CULTURE, BLOOD (ROUTINE X 2)  CULTURE, BLOOD (ROUTINE X 2)  URINE CULTURE  I-STAT CG4 LACTIC ACID, ED    EKG None  Radiology DG Chest Port 1 View Result Date: 02/20/2024 CLINICAL DATA:  Questionable sepsis - evaluate for abnormality EXAM: PORTABLE CHEST - 1 VIEW COMPARISON:  January 31, 2015 FINDINGS: No focal airspace consolidation, pleural effusion, or pneumothorax. No cardiomegaly. No acute fracture or destructive lesion. Multilevel thoracic osteophytosis. IMPRESSION: No acute cardiopulmonary abnormality. Electronically Signed   By: Rance Burrows M.D.   On: 02/20/2024 11:40    Procedures Procedures    Medications Ordered in ED Medications  lactated ringers bolus 1,000 mL (has no administration in time range)  cefTRIAXone  (ROCEPHIN ) 1 g in sodium chloride  0.9 % 100 mL IVPB (1 g Intravenous New Bag/Given 02/20/24 1048)    ED Course/ Medical Decision Making/ A&P Clinical Course as of 02/20/24 1143  Fri Feb 20, 2024  1034 I-Stat Lactic Acid, ED(!!) Lactic acid elevated 2.4.  White count elevated 15.5. [JK]  1100 CBC with Differential(!) White blood cell count elevated. [JK]  1101 Comprehensive metabolic panel(!) Sodium level decreased glucose increased [JK]  1143 Case discussed with Dr. Bonita Bussing regarding admission [JK]    Clinical Course User Index [JK] Trish Furl, MD                                 Medical Decision Making Problems Addressed: Acute cystitis without hematuria: acute illness or injury that poses a threat to life or bodily  functions Elevated lactic acid level: acute illness or injury that poses a threat to life or bodily functions Hyponatremia: acute illness or injury  Amount and/or Complexity of Data Reviewed Labs: ordered. Decision-making details documented in ED Course. Radiology: ordered and independent interpretation performed.  Risk Decision regarding hospitalization.   Patient  presented to the ED for evaluation of fevers after recent prostate biopsy.  Patient already has been taking Levaquin orally.  Patient spiked another temperature today up to 102.  He was sent to the ED for further evaluation.  Patient's laboratory tests are notable for elevated white blood cell count.  Decreased sodium.  Urinalysis is consistent with infection.  With his persistent fevers elevated lactic acid level and leukocytosis he was given a dose of IV antibiotics and I will consult the medical service for admission.  Will need to monitor closely for the possibility of evolving sepsis.  At this time patient is normotensive and not tachycardic without signs of shock         Final Clinical Impression(s) / ED Diagnoses Final diagnoses:  Hyponatremia  Acute cystitis without hematuria  Elevated lactic acid level    Rx / DC Orders ED Discharge Orders     None         Trish Furl, MD 02/20/24 1143

## 2024-02-20 NOTE — ED Notes (Signed)
 Patient made aware of needing urine sample

## 2024-02-21 DIAGNOSIS — N390 Urinary tract infection, site not specified: Secondary | ICD-10-CM | POA: Diagnosis not present

## 2024-02-21 DIAGNOSIS — A419 Sepsis, unspecified organism: Secondary | ICD-10-CM | POA: Diagnosis not present

## 2024-02-21 LAB — CBC WITH DIFFERENTIAL/PLATELET
Abs Immature Granulocytes: 0.04 10*3/uL (ref 0.00–0.07)
Basophils Absolute: 0.1 10*3/uL (ref 0.0–0.1)
Basophils Relative: 1 %
Eosinophils Absolute: 0.2 10*3/uL (ref 0.0–0.5)
Eosinophils Relative: 1 %
HCT: 43.2 % (ref 39.0–52.0)
Hemoglobin: 13.8 g/dL (ref 13.0–17.0)
Immature Granulocytes: 0 %
Lymphocytes Relative: 38 %
Lymphs Abs: 5 10*3/uL — ABNORMAL HIGH (ref 0.7–4.0)
MCH: 31.2 pg (ref 26.0–34.0)
MCHC: 31.9 g/dL (ref 30.0–36.0)
MCV: 97.5 fL (ref 80.0–100.0)
Monocytes Absolute: 1.3 10*3/uL — ABNORMAL HIGH (ref 0.1–1.0)
Monocytes Relative: 10 %
Neutro Abs: 6.4 10*3/uL (ref 1.7–7.7)
Neutrophils Relative %: 50 %
Platelets: 179 10*3/uL (ref 150–400)
RBC: 4.43 MIL/uL (ref 4.22–5.81)
RDW: 12.2 % (ref 11.5–15.5)
WBC: 12.9 10*3/uL — ABNORMAL HIGH (ref 4.0–10.5)
nRBC: 0 % (ref 0.0–0.2)

## 2024-02-21 LAB — GLUCOSE, CAPILLARY
Glucose-Capillary: 128 mg/dL — ABNORMAL HIGH (ref 70–99)
Glucose-Capillary: 203 mg/dL — ABNORMAL HIGH (ref 70–99)
Glucose-Capillary: 156 mg/dL — ABNORMAL HIGH (ref 70–99)
Glucose-Capillary: 179 mg/dL — ABNORMAL HIGH (ref 70–99)

## 2024-02-21 LAB — COMPREHENSIVE METABOLIC PANEL WITH GFR
ALT: 20 U/L (ref 0–44)
AST: 22 U/L (ref 15–41)
Albumin: 3.4 g/dL — ABNORMAL LOW (ref 3.5–5.0)
Alkaline Phosphatase: 42 U/L (ref 38–126)
Anion gap: 8 (ref 5–15)
BUN: 12 mg/dL (ref 8–23)
CO2: 26 mmol/L (ref 22–32)
Calcium: 9.3 mg/dL (ref 8.9–10.3)
Chloride: 101 mmol/L (ref 98–111)
Creatinine, Ser: 0.93 mg/dL (ref 0.61–1.24)
GFR, Estimated: >60 mL/min (ref >60)
Glucose, Bld: 135 mg/dL — ABNORMAL HIGH (ref 70–99)
Potassium: 4.5 mmol/L (ref 3.5–5.1)
Sodium: 135 mmol/L (ref 135–145)
Total Bilirubin: 0.7 mg/dL (ref 0.0–1.2)
Total Protein: 6.7 g/dL (ref 6.5–8.1)

## 2024-02-21 NOTE — Progress Notes (Signed)
 Notified MD pt requesting simethicone  for gas pains.

## 2024-02-21 NOTE — Plan of Care (Signed)

## 2024-02-21 NOTE — Progress Notes (Signed)
 PROGRESS NOTE    Samuel Huang  ZOX:[id number] DOB: February 04, 1951 DOA: 02/20/2024 PCP: Rae Bugler, MD   Brief Narrative:  73 year old male with history of anxiety/depression, hyperlipidemia, hypertension, BPH who underwent a prostate biopsy recently presented with hematuria and fever despite being on oral Levaquin as an outpatient on presentation, UA was suggestive of UTI, WBC of 15.5, lactic acid of 2.4 and subsequently 1.9.  Portable chest x-ray showed no acute cardiopulmonary abnormality.  He was started on IV fluids and antibiotics.  Assessment & Plan:   Sepsis: Present on admission UTI: Present on admission History of BPH status post recent prostate biopsy Lactic acidosis: Resolved -underwent a prostate biopsy recently presented with hematuria and fever despite being on oral Levaquin as an outpatient on presentation.  Presented with fever, leukocytosis, lactic acidosis with possible UTI. - Continue cefepime .  Follow cultures. - Admitting hospitalist briefly discussed with Dr. Secundino Dach on presentation.  Outpatient follow-up with urology  Hypertension Hyperlipidemia - Blood pressure stable.  Continue amlodipine , Coreg , losartan and statin  Leukocytosis - Improving  Hyponatremia - Improved   DVT prophylaxis: SCDs Code Status: Full Family Communication: Wife at bedside  disposition Plan: Status is: Inpatient Remains inpatient appropriate because: Of severity of illness  Consultants: None  Procedures: None  Antimicrobials:  Anti-infectives (From admission, onward)    Start     Dose/Rate Route Frequency Ordered Stop   02/20/24 1400  ceFEPIme  (MAXIPIME ) 2 g in sodium chloride  0.9 % 100 mL IVPB        2 g 200 mL/hr over 30 Minutes Intravenous Every 8 hours 02/20/24 1147     02/20/24 1045  cefTRIAXone  (ROCEPHIN ) 1 g in sodium chloride  0.9 % 100 mL IVPB        1 g 200 mL/hr over 30 Minutes Intravenous  Once 02/20/24 1035 02/20/24 1152        Subjective: Patient seen  and examined at bedside.  Feels slightly better.  No fever, vomiting, abdominal pain reported.  Objective: Vitals:   02/20/24 1100 02/20/24 1409 02/20/24 1956 02/21/24 0322  BP: 114/66 123/75 137/78 123/73  Pulse: 79 80 78 68  Resp: 16 18 18 18   Temp:  98.1 F (36.7 C) 99 F (37.2 C) 98.5 F (36.9 C)  TempSrc:      SpO2: 96% 98% 96% 97%    Intake/Output Summary (Last 24 hours) at 02/21/2024 8119 Last data filed at 02/20/2024 1741 Gross per 24 hour  Intake 100 ml  Output --  Net 100 ml   There were no vitals filed for this visit.  Examination:  General exam: Appears calm and comfortable.  Currently on room air. Respiratory system: Bilateral decreased breath sounds at bases Cardiovascular system: S1 & S2 heard, Rate controlled Gastrointestinal system: Abdomen is nondistended, soft and nontender. Normal bowel sounds heard. Extremities: No cyanosis, clubbing, edema  Central nervous system: Alert and oriented. No focal neurological deficits. Moving extremities Skin: No rashes, lesions or ulcers Psychiatry: Judgement and insight appear normal. Mood & affect appropriate.     Data Reviewed: I have personally reviewed following labs and imaging studies  CBC: Recent Labs  Lab 02/20/24 1001 02/21/24 0503  WBC 15.5* 12.9*  NEUTROABS 9.3* 6.4  HGB 14.5 13.8  HCT 44.1 43.2  MCV 95.5 97.5  PLT 207 179   Basic Metabolic Panel: Recent Labs  Lab 02/20/24 1001 02/21/24 0503  NA 130* 135  K 4.0 4.5  CL 98 101  CO2 23 26  GLUCOSE 210* 135*  BUN  17 12  CREATININE 1.24 0.93  CALCIUM 9.4 9.3   GFR: CrCl cannot be calculated (Unknown ideal weight.). Liver Function Tests: Recent Labs  Lab 02/20/24 1001 02/21/24 0503  AST 25 22  ALT 25 20  ALKPHOS 46 42  BILITOT 0.9 0.7  PROT 7.0 6.7  ALBUMIN 3.7 3.4*   No results for input(s): "LIPASE", "AMYLASE" in the last 168 hours. No results for input(s): "AMMONIA" in the last 168 hours. Coagulation Profile: No results for  input(s): "INR", "PROTIME" in the last 168 hours. Cardiac Enzymes: No results for input(s): "CKTOTAL", "CKMB", "CKMBINDEX", "TROPONINI" in the last 168 hours. BNP (last 3 results) No results for input(s): "PROBNP" in the last 8760 hours. HbA1C: No results for input(s): "HGBA1C" in the last 72 hours. CBG: Recent Labs  Lab 02/20/24 2108  GLUCAP 132*   Lipid Profile: No results for input(s): "CHOL", "HDL", "LDLCALC", "TRIG", "CHOLHDL", "LDLDIRECT" in the last 72 hours. Thyroid  Function Tests: No results for input(s): "TSH", "T4TOTAL", "FREET4", "T3FREE", "THYROIDAB" in the last 72 hours. Anemia Panel: No results for input(s): "VITAMINB12", "FOLATE", "FERRITIN", "TIBC", "IRON", "RETICCTPCT" in the last 72 hours. Sepsis Labs: Recent Labs  Lab 02/20/24 1005 02/20/24 1229  LATICACIDVEN 2.4* 1.9    Recent Results (from the past 240 hours)  Blood Culture (routine x 2)     Status: None (Preliminary result)   Collection Time: 02/20/24 10:01 AM   Specimen: BLOOD  Result Value Ref Range Status   Specimen Description   Final    BLOOD RIGHT ANTECUBITAL Performed at [facility], [address], [place], [place] [address]    Special Requests   Final    BOTTLES DRAWN AEROBIC AND ANAEROBIC Blood Culture results may not be optimal due to an inadequate volume of blood received in culture bottles Performed at [facility], [address], [place], [place] [address]    Culture   Final    NO GROWTH < 24 HOURS Performed at [facility], [address] [place], [place] [address]    Report Status PENDING  Incomplete  Blood Culture (routine x 2)     Status: None (Preliminary result)   Collection Time: 02/20/24 10:07 AM   Specimen: BLOOD  Result Value Ref Range Status   Specimen Description   Final    BLOOD BLOOD RIGHT FOREARM Performed at [facility], [address] [place], [place] [address]    Special Requests   Final     BOTTLES DRAWN AEROBIC AND ANAEROBIC Blood Culture results may not be optimal due to an inadequate volume of blood received in culture bottles Performed at [facility], [address] [place], [place] [address]    Culture   Final    NO GROWTH < 24 HOURS Performed at [facility], [address] [place], [place] [address]    Report Status PENDING  Incomplete         Radiology Studies: DG Chest Port 1 View Result Date: 02/20/2024 CLINICAL DATA:  Questionable sepsis - evaluate for abnormality EXAM: PORTABLE CHEST - 1 VIEW COMPARISON:  January 31, 2015 FINDINGS: No focal airspace consolidation, pleural effusion, or pneumothorax. No cardiomegaly. No acute fracture or destructive lesion. Multilevel thoracic osteophytosis. IMPRESSION: No acute cardiopulmonary abnormality. Electronically Signed   By: Rance Burrows M.D.   On: 02/20/2024 11:40        Scheduled Meds:  amLODipine   2.5 mg Oral QHS   carvedilol   6.25 mg  Oral BID WC   losartan  100 mg Oral Daily   simvastatin   20 mg Oral QHS   Continuous Infusions:  ceFEPime  (MAXIPIME ) IV 2 g (02/21/24 0506)          Audria Leather, MD [facility] 02/21/2024, 7:33 AM

## 2024-02-22 DIAGNOSIS — N390 Urinary tract infection, site not specified: Secondary | ICD-10-CM | POA: Diagnosis not present

## 2024-02-22 DIAGNOSIS — A419 Sepsis, unspecified organism: Secondary | ICD-10-CM | POA: Diagnosis not present

## 2024-02-22 DIAGNOSIS — B9620 Unspecified Escherichia coli [E. coli] as the cause of diseases classified elsewhere: Secondary | ICD-10-CM

## 2024-02-22 DIAGNOSIS — Z1612 Extended spectrum beta lactamase (ESBL) resistance: Secondary | ICD-10-CM | POA: Diagnosis not present

## 2024-02-22 LAB — CBC WITH DIFFERENTIAL/PLATELET
Abs Immature Granulocytes: 0.04 10*3/uL (ref 0.00–0.07)
Basophils Absolute: 0.1 10*3/uL (ref 0.0–0.1)
Basophils Relative: 0 %
Eosinophils Absolute: 0.3 10*3/uL (ref 0.0–0.5)
Eosinophils Relative: 2 %
HCT: 41.7 % (ref 39.0–52.0)
Hemoglobin: 13.5 g/dL (ref 13.0–17.0)
Immature Granulocytes: 0 %
Lymphocytes Relative: 47 %
Lymphs Abs: 6.3 10*3/uL — ABNORMAL HIGH (ref 0.7–4.0)
MCH: 31.8 pg (ref 26.0–34.0)
MCHC: 32.4 g/dL (ref 30.0–36.0)
MCV: 98.1 fL (ref 80.0–100.0)
Monocytes Absolute: 1.6 10*3/uL — ABNORMAL HIGH (ref 0.1–1.0)
Monocytes Relative: 12 %
Neutro Abs: 5.4 10*3/uL (ref 1.7–7.7)
Neutrophils Relative %: 39 %
Platelets: 188 10*3/uL (ref 150–400)
RBC: 4.25 MIL/uL (ref 4.22–5.81)
RDW: 12.1 % (ref 11.5–15.5)
WBC: 13.7 10*3/uL — ABNORMAL HIGH (ref 4.0–10.5)
nRBC: 0 % (ref 0.0–0.2)

## 2024-02-22 LAB — GLUCOSE, CAPILLARY
Glucose-Capillary: 136 mg/dL — ABNORMAL HIGH (ref 70–99)
Glucose-Capillary: 172 mg/dL — ABNORMAL HIGH (ref 70–99)
Glucose-Capillary: 162 mg/dL — ABNORMAL HIGH (ref 70–99)
Glucose-Capillary: 192 mg/dL — ABNORMAL HIGH (ref 70–99)

## 2024-02-22 LAB — URINE CULTURE: Culture: 20000 — AB

## 2024-02-22 LAB — MAGNESIUM: Magnesium: 2.3 mg/dL (ref 1.7–2.4)

## 2024-02-22 LAB — BASIC METABOLIC PANEL WITH GFR
Anion gap: 10 (ref 5–15)
BUN: 15 mg/dL (ref 8–23)
CO2: 25 mmol/L (ref 22–32)
Calcium: 9.2 mg/dL (ref 8.9–10.3)
Chloride: 102 mmol/L (ref 98–111)
Creatinine, Ser: 1.02 mg/dL (ref 0.61–1.24)
GFR, Estimated: >60 mL/min (ref >60)
Glucose, Bld: 147 mg/dL — ABNORMAL HIGH (ref 70–99)
Potassium: 4.6 mmol/L (ref 3.5–5.1)
Sodium: 137 mmol/L (ref 135–145)

## 2024-02-22 MED ORDER — SODIUM CHLORIDE 0.9 % IV SOLN
1.0000 g | INTRAVENOUS | Status: DC
  Administered 2024-02-22 – 2024-02-23 (×2): 1 g via INTRAVENOUS
  Filled 2024-02-22 (×2): qty 1000

## 2024-02-22 NOTE — Consult Note (Signed)
 [facility]    Date of Admission:  02/20/2024   Total days of inpatient antibiotics 2        Reason for Consult: E coli uti    Principal Problem:   Sepsis secondary to UTI [facility]) Active Problems:   Hypertension   Mixed hyperlipidemia   BPH (benign prostatic hyperplasia)   Assessment: [age]-year-old male with past medical history of anxiety/depression, BPH, underwent prostate biopsy on 4/29 developed fevers and chills the next day found to have: #ESBL ecoli uti in the settinf of recent instrumentaiton - Patient states he took Levaquin on day of biopsy.  Next day developed fevers and chills.  On arrival he was afebrile,  white cell count of 15K.  UA had large leukocytes, urine cultures grew 20K colonies of E. Coli/ Recommendations:  -d/c cefepime  -start ertapenem->continue inpatient -Transition to macrobid 100mg  PO bid  to complete 7  days of abx from starting ertapenem -Plan dicussed with primary  ID will sign off  Evaluation of this patient requires complex antimicrobial therapy evaluation and counseling + isolation needs for disease transmission risk assessment and mitigation   Microbiology:   Antibiotics: Cefepime  5/2-  Cultures: Blood 5/2 ng Urine 5/2 Escherichia coli      MIC    AMPICILLIN >=32 RESIST... Resistant    AMPICILLIN/SULBACTAM 4 SENSITIVE Sensitive    CEFAZOLIN >=64 RESIST... Resistant    CEFEPIME  16 RESISTANT Resistant    CEFTRIAXONE  >=64 RESIST... Resistant    CIPROFLOXACIN >=4 RESISTANT Resistant    GENTAMICIN >=16 RESIST... Resistant    IMIPENEM <=0.25 SENS... Sensitive    NITROFURANTOIN <=16 SENSIT... Sensitive    PIP/TAZO <=4 SENSITI... Sensitive    TRIMETH/SULFA >=320 RESIS... Resistant    Other   HPI: SHION STIFEL is a 73 y.o. male with past medical history of anxiety, depression, hemorrhoids hyperlipidemia/hypertension, insomnia, osteopenia, vitamin D deficiency, ED, BPH who recently underwent  prostate biopsy on 4/29.  Patient states the next day he had fevers and chills.  He noted that he was on oral Levaquin on day of biopsy.  On arrival to the ED patient is afebrile, WBC 15K. UA had large leukocytes, negative nitrites.  Started on ceftriaxone  then transition cefepime .  Urine cultures grew 20K colonies of ESBL E. coli.  ID [place] antibiotic commendations.   Review of Systems: Review of Systems  All other systems reviewed and are negative.   Past Medical History:  Diagnosis Date   Anxiety    BPH (benign prostatic hyperplasia)    Cataract    Depression    work related/ retired   ED (erectile dysfunction)    GERD (gastroesophageal reflux disease)    Hemorrhoids    Hyperlipidemia    Hypertension    Insomnia    Osteopenia    Vitamin D deficiency     Social History   Tobacco Use   Smoking status: Former    Current packs/day: 0.00    Average packs/day: 0.3 packs/day for 3.0 years (0.9 ttl pk-yrs)    Types: Cigarettes    Start date: 10/22/1975    Quit date: 10/21/1978    Years since quitting: 45.3   Smokeless tobacco: Never  Vaping Use   Vaping status: Never Used  Substance Use Topics   Alcohol use: Yes    Comment: occ   Drug use: No    Family History  Problem Relation Age of Onset   Heart disease Mother  Diabetes Father    Diabetes Sister    Colon cancer Neg Hx    Stomach cancer Neg Hx    Rectal cancer Neg Hx    Esophageal cancer Neg Hx    Liver cancer Neg Hx    Scheduled Meds:  amLODipine   2.5 mg Oral QHS   carvedilol   6.25 mg Oral BID WC   losartan  100 mg Oral Daily   simvastatin   20 mg Oral QHS   Continuous Infusions:  ertapenem Stopped (02/22/24 1508)   PRN Meds:.acetaminophen  **OR** acetaminophen , ondansetron **OR** ondansetron (ZOFRAN) IV, polyethylene glycol, simethicone  Allergies  Allergen Reactions   Penicillins Rash    OBJECTIVE: Blood pressure 128/76, pulse 66, temperature 97.9 F (36.6 C), temperature source Oral, resp. rate  17, height 5\' 6"  (1.676 m), weight 78 kg, SpO2 98%.  Physical Exam Constitutional:      General: He is not in acute distress.    Appearance: He is normal weight. He is not toxic-appearing.  HENT:     Head: Normocephalic and atraumatic.     Right Ear: External ear normal.     Left Ear: External ear normal.     Nose: No congestion or rhinorrhea.     Mouth/Throat:     Mouth: Mucous membranes are moist.     Pharynx: Oropharynx is clear.  Eyes:     Extraocular Movements: Extraocular movements intact.     Conjunctiva/sclera: Conjunctivae normal.     Pupils: Pupils are equal, round, and reactive to light.  Cardiovascular:     Rate and Rhythm: Normal rate and regular rhythm.     Heart sounds: No murmur heard.    No friction rub. No gallop.  Pulmonary:     Effort: Pulmonary effort is normal.     Breath sounds: Normal breath sounds.  Abdominal:     General: Abdomen is flat. Bowel sounds are normal.     Palpations: Abdomen is soft.  Musculoskeletal:        General: No swelling. Normal range of motion.     Cervical back: Normal range of motion and neck supple.  Skin:    General: Skin is warm and dry.  Neurological:     General: No focal deficit present.     Mental Status: He is oriented to person, place, and time.  Psychiatric:        Mood and Affect: Mood normal.     Lab Results Lab Results  Component Value Date   WBC 13.7 (H) 02/22/2024   HGB 13.5 02/22/2024   HCT 41.7 02/22/2024   MCV 98.1 02/22/2024   PLT 188 02/22/2024    Lab Results  Component Value Date   CREATININE 1.02 02/22/2024   BUN 15 02/22/2024   NA 137 02/22/2024   K 4.6 02/22/2024   CL 102 02/22/2024   CO2 25 02/22/2024    Lab Results  Component Value Date   ALT 20 02/21/2024   AST 22 02/21/2024   ALKPHOS 42 02/21/2024   BILITOT 0.7 02/21/2024       Orlie Bjornstad, MD [facility] 02/22/2024, 10:24 PM

## 2024-02-22 NOTE — Plan of Care (Signed)

## 2024-02-22 NOTE — Progress Notes (Signed)
 PROGRESS NOTE    Samuel Huang  UJW:[id number] DOB: [birth year] DOA: 02/20/2024 PCP: Rae Bugler, MD   Brief Narrative:  73 year old male with history of anxiety/depression, hyperlipidemia, hypertension, BPH who underwent a prostate biopsy recently presented with hematuria and fever despite being on oral Levaquin as an outpatient on presentation, UA was suggestive of UTI, WBC of 15.5, lactic acid of 2.4 and subsequently 1.9.  Portable chest x-ray showed no acute cardiopulmonary abnormality.  He was started on IV fluids and antibiotics.  Assessment & Plan:   Sepsis: Present on admission UTI: Present on admission History of BPH status post recent prostate biopsy Lactic acidosis: Resolved -underwent a prostate biopsy recently presented with hematuria and fever despite being on oral Levaquin as an outpatient on presentation.  Presented with fever, leukocytosis, lactic acidosis with possible UTI. - Currently on cefepime .  Urine culture growing E. coli which is resistant to many antibiotics including cefepime .  I have consulted ID: Await recommendations.  Will not make change in antibiotic for now since patient is clinically much improved and is hoping to go home today. - Admitting hospitalist briefly discussed with Dr. Secundino Dach on presentation.  Outpatient follow-up with urology  Hypertension Hyperlipidemia - Blood pressure stable.  Continue amlodipine , Coreg , losartan and statin  Leukocytosis - WBC 13.7 today.  Monitor  Hyponatremia - Improved   DVT prophylaxis: SCDs Code Status: Full Family Communication: Wife at bedside  disposition Plan: Status is: Inpatient Remains inpatient appropriate because: Of severity of illness  Consultants: None  Procedures: None  Antimicrobials:  Anti-infectives (From admission, onward)    Start     Dose/Rate Route Frequency Ordered Stop   02/20/24 1400  ceFEPIme  (MAXIPIME ) 2 g in sodium chloride  0.9 % 100 mL IVPB        2 g 200 mL/hr over 30  Minutes Intravenous Every 8 hours 02/20/24 1147     02/20/24 1045  cefTRIAXone  (ROCEPHIN ) 1 g in sodium chloride  0.9 % 100 mL IVPB        1 g 200 mL/hr over 30 Minutes Intravenous  Once 02/20/24 1035 02/20/24 1152        Subjective: Patient seen and examined at bedside.  Denies any worsening abdominal pain, fever, vomiting. Objective: Vitals:   02/21/24 1237 02/21/24 2028 02/22/24 0500 02/22/24 0540  BP: 123/80 123/81  117/73  Pulse: 71 78  73  Resp:  18  18  Temp: 98.3 F (36.8 C) 98.7 F (37.1 C)  98 F (36.7 C)  TempSrc:  Oral    SpO2: 96% 96%  95%  Weight:  78 kg 78 kg   Height:  5\' 6"  (1.676 m)      Intake/Output Summary (Last 24 hours) at 02/22/2024 1108 Last data filed at 02/22/2024 0730 Gross per 24 hour  Intake 240 ml  Output --  Net 240 ml   Filed Weights   02/21/24 2028 02/22/24 0500  Weight: 78 kg 78 kg    Examination:  General: On room air.  No distress. ENT/neck: No thyromegaly.  JVD is not elevated  respiratory: Decreased breath sounds at bases bilaterally with some crackles; no wheezing CVS: S1-S2 heard, rate controlled currently Abdominal: Soft, nontender, slightly distended; no organomegaly,  bowel sounds are heard Extremities: Trace lower extremity edema; no cyanosis  CNS: Awake and alert.  No focal neurologic deficit.  Moves extremities Lymph: No obvious lymphadenopathy Skin: No obvious ecchymosis/lesions  psych: Affect, judgment and mood are normal  musculoskeletal: No obvious joint swelling/deformity  Data Reviewed: I have personally reviewed following labs and imaging studies  CBC: Recent Labs  Lab 02/20/24 1001 02/21/24 0503 02/22/24 0456  WBC 15.5* 12.9* 13.7*  NEUTROABS 9.3* 6.4 5.4  HGB 14.5 13.8 13.5  HCT 44.1 43.2 41.7  MCV 95.5 97.5 98.1  PLT 207 179 188   Basic Metabolic Panel: Recent Labs  Lab 02/20/24 1001 02/21/24 0503 02/22/24 0456  NA 130* 135 137  K 4.0 4.5 4.6  CL 98 101 102  CO2 23 26 25   GLUCOSE  210* 135* 147*  BUN 17 12 15   CREATININE 1.24 0.93 1.02  CALCIUM 9.4 9.3 9.2  MG  --   --  2.3   GFR: Estimated Creatinine Clearance: 63.4 mL/min (by C-G formula based on SCr of 1.02 mg/dL). Liver Function Tests: Recent Labs  Lab 02/20/24 1001 02/21/24 0503  AST 25 22  ALT 25 20  ALKPHOS 46 42  BILITOT 0.9 0.7  PROT 7.0 6.7  ALBUMIN 3.7 3.4*   No results for input(s): "LIPASE", "AMYLASE" in the last 168 hours. No results for input(s): "AMMONIA" in the last 168 hours. Coagulation Profile: No results for input(s): "INR", "PROTIME" in the last 168 hours. Cardiac Enzymes: No results for input(s): "CKTOTAL", "CKMB", "CKMBINDEX", "TROPONINI" in the last 168 hours. BNP (last 3 results) No results for input(s): "PROBNP" in the last 8760 hours. HbA1C: No results for input(s): "HGBA1C" in the last 72 hours. CBG: Recent Labs  Lab 02/21/24 0739 02/21/24 1144 02/21/24 1648 02/21/24 2112 02/22/24 0751  GLUCAP 128* 203* 156* 179* 136*   Lipid Profile: No results for input(s): "CHOL", "HDL", "LDLCALC", "TRIG", "CHOLHDL", "LDLDIRECT" in the last 72 hours. Thyroid  Function Tests: No results for input(s): "TSH", "T4TOTAL", "FREET4", "T3FREE", "THYROIDAB" in the last 72 hours. Anemia Panel: No results for input(s): "VITAMINB12", "FOLATE", "FERRITIN", "TIBC", "IRON", "RETICCTPCT" in the last 72 hours. Sepsis Labs: Recent Labs  Lab 02/20/24 1005 02/20/24 1229  LATICACIDVEN 2.4* 1.9    Recent Results (from the past 240 hours)  Blood Culture (routine x 2)     Status: None (Preliminary result)   Collection Time: 02/20/24 10:01 AM   Specimen: BLOOD  Result Value Ref Range Status   Specimen Description   Final    BLOOD RIGHT ANTECUBITAL Performed at [facility], [address] [place], [place] [address]    Special Requests   Final    BOTTLES DRAWN AEROBIC AND ANAEROBIC Blood Culture results may not be optimal due to an inadequate volume of blood received in  culture bottles Performed at [facility], [address] [place], [place] [address]    Culture   Final    NO GROWTH 2 DAYS Performed at [facility], [address] [place], [place] [address]    Report Status PENDING  Incomplete  Blood Culture (routine x 2)     Status: None (Preliminary result)   Collection Time: 02/20/24 10:07 AM   Specimen: BLOOD RIGHT FOREARM  Result Value Ref Range Status   Specimen Description   Final    BLOOD RIGHT FOREARM Performed at [facility], [address] [place], [place] [address]    Special Requests   Final    BOTTLES DRAWN AEROBIC AND ANAEROBIC Blood Culture results may not be optimal due to an inadequate volume of blood received in culture bottles Performed at [facility], [address], [place], [place] [address]    Culture   Final  NO GROWTH 2 DAYS Performed at [facility], [address], [place], [place] [address]    Report Status PENDING  Incomplete  Urine Culture     Status: Abnormal   Collection Time: 02/20/24 10:51 AM   Specimen: Urine, Random  Result Value Ref Range Status   Specimen Description   Final    URINE, RANDOM Performed at [facility], [address] [place], [place] [address]    Special Requests   Final    NONE Reflexed from [id number] Performed at [facility], [address], [place], [place] [address]    Culture (A)  Final    20,000 COLONIES/mL ESCHERICHIA COLI Confirmed Extended Spectrum Beta-Lactamase Producer (ESBL).  In bloodstream infections from ESBL organisms, carbapenems are preferred over piperacillin/tazobactam. They are shown to have a lower risk of mortality.    Report Status 02/22/2024 FINAL  Final   Organism ID, Bacteria ESCHERICHIA COLI (A)  Final      Susceptibility   Escherichia coli - MIC*    AMPICILLIN >=32 RESISTANT Resistant     CEFAZOLIN >=64 RESISTANT Resistant     CEFEPIME  16 RESISTANT  Resistant     CEFTRIAXONE  >=64 RESISTANT Resistant     CIPROFLOXACIN >=4 RESISTANT Resistant     GENTAMICIN >=16 RESISTANT Resistant     IMIPENEM <=0.25 SENSITIVE Sensitive     NITROFURANTOIN <=16 SENSITIVE Sensitive     TRIMETH/SULFA >=320 RESISTANT Resistant     AMPICILLIN/SULBACTAM 4 SENSITIVE Sensitive     PIP/TAZO <=4 SENSITIVE Sensitive ug/mL    * 20,000 COLONIES/mL ESCHERICHIA COLI         Radiology Studies: No results found.       Scheduled Meds:  amLODipine   2.5 mg Oral QHS   carvedilol   6.25 mg Oral BID WC   losartan  100 mg Oral Daily   simvastatin   20 mg Oral QHS   Continuous Infusions:  ceFEPime  (MAXIPIME ) IV 2 g (02/22/24 0538)          Audria Leather, MD [facility] 02/22/2024, 11:08 AM

## 2024-02-23 DIAGNOSIS — A419 Sepsis, unspecified organism: Secondary | ICD-10-CM | POA: Diagnosis not present

## 2024-02-23 DIAGNOSIS — N390 Urinary tract infection, site not specified: Secondary | ICD-10-CM | POA: Diagnosis not present

## 2024-02-23 LAB — CBC WITH DIFFERENTIAL/PLATELET
Abs Immature Granulocytes: 0.03 10*3/uL (ref 0.00–0.07)
Basophils Absolute: 0.1 10*3/uL (ref 0.0–0.1)
Basophils Relative: 1 %
Eosinophils Absolute: 0.3 10*3/uL (ref 0.0–0.5)
Eosinophils Relative: 3 %
HCT: 41.1 % (ref 39.0–52.0)
Hemoglobin: 13.7 g/dL (ref 13.0–17.0)
Immature Granulocytes: 0 %
Lymphocytes Relative: 54 %
Lymphs Abs: 6.3 10*3/uL — ABNORMAL HIGH (ref 0.7–4.0)
MCH: 32 pg (ref 26.0–34.0)
MCHC: 33.3 g/dL (ref 30.0–36.0)
MCV: 96 fL (ref 80.0–100.0)
Monocytes Absolute: 1.1 10*3/uL — ABNORMAL HIGH (ref 0.1–1.0)
Monocytes Relative: 10 %
Neutro Abs: 3.6 10*3/uL (ref 1.7–7.7)
Neutrophils Relative %: 32 %
Platelets: 218 10*3/uL (ref 150–400)
RBC: 4.28 MIL/uL (ref 4.22–5.81)
RDW: 12 % (ref 11.5–15.5)
WBC: 11.4 10*3/uL — ABNORMAL HIGH (ref 4.0–10.5)
nRBC: 0 % (ref 0.0–0.2)

## 2024-02-23 LAB — BASIC METABOLIC PANEL WITH GFR
Anion gap: 8 (ref 5–15)
BUN: 14 mg/dL (ref 8–23)
CO2: 27 mmol/L (ref 22–32)
Calcium: 9.4 mg/dL (ref 8.9–10.3)
Chloride: 103 mmol/L (ref 98–111)
Creatinine, Ser: 0.88 mg/dL (ref 0.61–1.24)
GFR, Estimated: >60 mL/min (ref >60)
Glucose, Bld: 138 mg/dL — ABNORMAL HIGH (ref 70–99)
Potassium: 4.7 mmol/L (ref 3.5–5.1)
Sodium: 138 mmol/L (ref 135–145)

## 2024-02-23 LAB — MAGNESIUM: Magnesium: 2.3 mg/dL (ref 1.7–2.4)

## 2024-02-23 LAB — GLUCOSE, CAPILLARY: Glucose-Capillary: 132 mg/dL — ABNORMAL HIGH (ref 70–99)

## 2024-02-23 LAB — C-REACTIVE PROTEIN: CRP: 3.5 mg/dL — ABNORMAL HIGH (ref <1.0)

## 2024-02-23 MED ORDER — LOSARTAN POTASSIUM 100 MG PO TABS: 100.0000 mg | ORAL_TABLET | Freq: Every day | ORAL | 0 refills | Status: DC

## 2024-02-23 MED ORDER — NITROFURANTOIN MONOHYD MACRO 100 MG PO CAPS: 100.0000 mg | ORAL_CAPSULE | Freq: Two times a day (BID) | ORAL | 0 refills | Status: AC

## 2024-02-23 NOTE — Discharge Summary (Signed)
 Physician Discharge Summary  Samuel Huang EML:[id number] DOB: 04-Aug-1951 DOA: 02/20/2024  PCP: Rae Bugler, MD  Admit date: 02/20/2024 Discharge date: 02/23/2024  Admitted From: Home Disposition: Home  Recommendations for Outpatient Follow-up:  Follow up with PCP in 1 week with repeat CBC/BMP Outpatient follow-up with urology Follow up in ED if symptoms worsen or new appear   Home Health: No Equipment/Devices: None  Discharge Condition: Stable CODE STATUS: Full Diet recommendation: Heart healthy  Brief/Interim Summary: 73 year old male with history of anxiety/depression, hyperlipidemia, hypertension, BPH who underwent a prostate biopsy recently presented with hematuria and fever despite being on oral Levaquin as an outpatient on presentation, UA was suggestive of UTI, WBC of 15.5, lactic acid of 2.4 and subsequently 1.9. Portable chest x-ray showed no acute cardiopulmonary abnormality. He was started on IV fluids and antibiotics.  During the hospitalization, his condition is improved.  Urine culture grew resistant E. coli; ID switched him to IV ertapenem while in the hospital and recommended to discharge him on oral Macrobid for a total of 7 days course of antibiotics.  Patient feels much better today and wants to go home today.  He is afebrile, hemodynamically stable with improving leukocytosis.  He will be discharged home on oral Macrobid for 5 more days.  Outpatient follow-up with PCP and urology.  Discharge Diagnoses:   Sepsis: Present on admission; resolved UTI: Present on admission History of BPH status post recent prostate biopsy Lactic acidosis: Resolved -underwent a prostate biopsy recently presented with hematuria and fever despite being on oral Levaquin as an outpatient on presentation.  Presented with fever, leukocytosis, lactic acidosis with possible UTI. - Treated with cefepime .  Urine culture growing E. coli which is resistant to many antibiotics including cefepime .  ID  switched him to IV ertapenem while in the hospital and recommended to discharge him on oral Macrobid for a total of 7 days course of antibiotics.  Patient feels much better today and wants to go home today.  He is afebrile, hemodynamically stable with improving leukocytosis.  He will be discharged home on oral Macrobid for 5 more days.  Outpatient follow-up with PCP and urology. - Admitting hospitalist briefly discussed with Dr. Secundino Dach on presentation.  Outpatient follow-up with urology   Hypertension Hyperlipidemia - Blood pressure stable.  Continue amlodipine , Coreg , losartan and statin   Leukocytosis - WBC improving to 11.4 today.  Monitor   Hyponatremia - Improved  Discharge Instructions  Discharge Instructions     Diet - low sodium heart healthy   Complete by: As directed    Increase activity slowly   Complete by: As directed       Allergies as of 02/23/2024       Reactions   Penicillins Rash        Medication List     STOP taking these medications    levofloxacin 500 MG tablet Commonly known as: LEVAQUIN   telmisartan 80 MG tablet Commonly known as: MICARDIS       TAKE these medications    amLODipine  2.5 MG tablet Commonly known as: NORVASC  Take 2.5 mg by mouth at bedtime.   carvedilol  6.25 MG tablet Commonly known as: COREG  Take 6.25 mg by mouth 2 (two) times daily with a meal.   fluticasone 50 MCG/ACT nasal spray Commonly known as: FLONASE Place 2 sprays into both nostrils in the morning.   losartan 100 MG tablet Commonly known as: COZAAR Take 1 tablet (100 mg total) by mouth at bedtime.   nitrofurantoin (macrocrystal-monohydrate)  100 MG capsule Commonly known as: Macrobid Take 1 capsule (100 mg total) by mouth 2 (two) times daily for 5 days. Start taking on: Feb 24, 2024   NON FORMULARY Take 2 tablets by mouth See admin instructions. Focus Factor- Take 2 tablets by mouth in the morning   sildenafil 20 MG tablet Commonly known as:  REVATIO Take 20-60 mg by mouth daily as needed (one hour prior to event).   simvastatin  20 MG tablet Commonly known as: ZOCOR  Take 20 mg by mouth at bedtime.   Vitamin C 500 MG Chew Chew 500 mg by mouth daily.   Vitamin D3 50 MCG (2000 UT) Tabs Take 2,000 Units by mouth at bedtime.   Xiidra 5 % Soln Generic drug: Lifitegrast Place 1 drop into both eyes in the morning.        Follow-up Information     Rae Bugler, MD. Schedule an appointment as soon as possible for a visit in 1 week(s).   Specialty: Family Medicine Contact information: [address], [address] [place] [address] [phone]                Allergies  Allergen Reactions   Penicillins Rash    Consultations: ID   Procedures/Studies: DG Chest Port 1 View Result Date: 02/20/2024 CLINICAL DATA:  Questionable sepsis - evaluate for abnormality EXAM: PORTABLE CHEST - 1 VIEW COMPARISON:  January 31, 2015 FINDINGS: No focal airspace consolidation, pleural effusion, or pneumothorax. No cardiomegaly. No acute fracture or destructive lesion. Multilevel thoracic osteophytosis. IMPRESSION: No acute cardiopulmonary abnormality. Electronically Signed   By: Rance Burrows M.D.   On: 02/20/2024 11:40      Subjective: Patient seen and examined at bedside today.  Patient feels better and wants to go home today.  No fever, vomiting or abdominal pain reported. Discharge Exam: Vitals:   02/22/24 2032 02/23/24 0555  BP: 128/76 127/77  Pulse: 66 72  Resp: 17 20  Temp: 97.9 F (36.6 C) 97.6 F (36.4 C)  SpO2: 98% 99%    General: Pt is alert, awake, not in acute distress Cardiovascular: rate controlled, S1/S2 + Respiratory: bilateral decreased breath sounds at bases Abdominal: Soft, NT, ND, bowel sounds + Extremities: no edema, no cyanosis    The results of significant diagnostics from this hospitalization (including imaging, microbiology, ancillary and laboratory) are listed below for reference.      Microbiology: Recent Results (from the past 240 hours)  Blood Culture (routine x 2)     Status: None (Preliminary result)   Collection Time: 02/20/24 10:01 AM   Specimen: BLOOD  Result Value Ref Range Status   Specimen Description   Final    BLOOD RIGHT ANTECUBITAL Performed at [facility], [address] [place], [place] [address]    Special Requests   Final    BOTTLES DRAWN AEROBIC AND ANAEROBIC Blood Culture results may not be optimal due to an inadequate volume of blood received in culture bottles Performed at [facility], [address], [place], [place] [address]    Culture   Final    NO GROWTH 3 DAYS Performed at [facility], [address], [place], [place] [address]    Report Status PENDING  Incomplete  Blood Culture (routine x 2)     Status: None (Preliminary result)   Collection Time: 02/20/24 10:07 AM   Specimen: BLOOD RIGHT FOREARM  Result Value Ref Range Status   Specimen Description   Final  BLOOD RIGHT FOREARM Performed at [facility], [address], [place], [place] [address]    Special Requests   Final    BOTTLES DRAWN AEROBIC AND ANAEROBIC Blood Culture results may not be optimal due to an inadequate volume of blood received in culture bottles Performed at [facility], [address] [place], [place] [address]    Culture   Final    NO GROWTH 3 DAYS Performed at [facility], [address], [place], [place] [address]    Report Status PENDING  Incomplete  Urine Culture     Status: Abnormal   Collection Time: 02/20/24 10:51 AM   Specimen: Urine, Random  Result Value Ref Range Status   Specimen Description   Final    URINE, RANDOM Performed at [facility], [address], [place], [place] [address]    Special Requests   Final    NONE Reflexed from [id number] Performed at [facility], [address] [place], [place] [address]     Culture (A)  Final    20,000 COLONIES/mL ESCHERICHIA COLI Confirmed Extended Spectrum Beta-Lactamase Producer (ESBL).  In bloodstream infections from ESBL organisms, carbapenems are preferred over piperacillin/tazobactam. They are shown to have a lower risk of mortality.    Report Status 02/22/2024 FINAL  Final   Organism ID, Bacteria ESCHERICHIA COLI (A)  Final      Susceptibility   Escherichia coli - MIC*    AMPICILLIN >=32 RESISTANT Resistant     CEFAZOLIN >=64 RESISTANT Resistant     CEFEPIME  16 RESISTANT Resistant     CEFTRIAXONE  >=64 RESISTANT Resistant     CIPROFLOXACIN >=4 RESISTANT Resistant     GENTAMICIN >=16 RESISTANT Resistant     IMIPENEM <=0.25 SENSITIVE Sensitive     NITROFURANTOIN <=16 SENSITIVE Sensitive     TRIMETH/SULFA >=320 RESISTANT Resistant     AMPICILLIN/SULBACTAM 4 SENSITIVE Sensitive     PIP/TAZO <=4 SENSITIVE Sensitive ug/mL    * 20,000 COLONIES/mL ESCHERICHIA COLI     Labs: BNP (last 3 results) No results for input(s): "BNP" in the last 8760 hours. Basic Metabolic Panel: Recent Labs  Lab 02/20/24 1001 02/21/24 0503 02/22/24 0456 02/23/24 0414  NA 130* 135 137 138  K 4.0 4.5 4.6 4.7  CL 98 101 102 103  CO2 23 26 25 27   GLUCOSE 210* 135* 147* 138*  BUN 17 12 15 14   CREATININE 1.24 0.93 1.02 0.88  CALCIUM 9.4 9.3 9.2 9.4  MG  --   --  2.3 2.3   Liver Function Tests: Recent Labs  Lab 02/20/24 1001 02/21/24 0503  AST 25 22  ALT 25 20  ALKPHOS 46 42  BILITOT 0.9 0.7  PROT 7.0 6.7  ALBUMIN 3.7 3.4*   No results for input(s): "LIPASE", "AMYLASE" in the last 168 hours. No results for input(s): "AMMONIA" in the last 168 hours. CBC: Recent Labs  Lab 02/20/24 1001 02/21/24 0503 02/22/24 0456 02/23/24 0414  WBC 15.5* 12.9* 13.7* 11.4*  NEUTROABS 9.3* 6.4 5.4 3.6  HGB 14.5 13.8 13.5 13.7  HCT 44.1 43.2 41.7 41.1  MCV 95.5 97.5 98.1 96.0  PLT 207 179 188 218   Cardiac Enzymes: No results for input(s): "CKTOTAL", "CKMB",  "CKMBINDEX", "TROPONINI" in the last 168 hours. BNP: Invalid input(s): "POCBNP" CBG: Recent Labs  Lab 02/22/24 0751 02/22/24 1202 02/22/24 1609 02/22/24 1950 02/23/24 0739  GLUCAP 136* 172* 162* 192* 132*   D-Dimer No results for input(s): "  DDIMER" in the last 72 hours. Hgb A1c No results for input(s): "HGBA1C" in the last 72 hours. Lipid Profile No results for input(s): "CHOL", "HDL", "LDLCALC", "TRIG", "CHOLHDL", "LDLDIRECT" in the last 72 hours. Thyroid  function studies No results for input(s): "TSH", "T4TOTAL", "T3FREE", "THYROIDAB" in the last 72 hours.  Invalid input(s): "FREET3" Anemia work up No results for input(s): "VITAMINB12", "FOLATE", "FERRITIN", "TIBC", "IRON", "RETICCTPCT" in the last 72 hours. Urinalysis    Component Value Date/Time   COLORURINE YELLOW 02/20/2024 1051   APPEARANCEUR CLOUDY (A) 02/20/2024 1051   LABSPEC 1.012 02/20/2024 1051   PHURINE 5.0 02/20/2024 1051   GLUCOSEU >=500 (A) 02/20/2024 1051   HGBUR LARGE (A) 02/20/2024 1051   BILIRUBINUR NEGATIVE 02/20/2024 1051   KETONESUR NEGATIVE 02/20/2024 1051   PROTEINUR 100 (A) 02/20/2024 1051   NITRITE NEGATIVE 02/20/2024 1051   LEUKOCYTESUR LARGE (A) 02/20/2024 1051   Sepsis Labs Recent Labs  Lab 02/20/24 1001 02/21/24 0503 02/22/24 0456 02/23/24 0414  WBC 15.5* 12.9* 13.7* 11.4*   Microbiology Recent Results (from the past 240 hours)  Blood Culture (routine x 2)     Status: None (Preliminary result)   Collection Time: 02/20/24 10:01 AM   Specimen: BLOOD  Result Value Ref Range Status   Specimen Description   Final    BLOOD RIGHT ANTECUBITAL Performed at [facility], [address], [place], [place] [address]    Special Requests   Final    BOTTLES DRAWN AEROBIC AND ANAEROBIC Blood Culture results may not be optimal due to an inadequate volume of blood received in culture bottles Performed at [facility], [address] [place], [place]  [address]    Culture   Final    NO GROWTH 3 DAYS Performed at [facility], [address], [place], [place] [address]    Report Status PENDING  Incomplete  Blood Culture (routine x 2)     Status: None (Preliminary result)   Collection Time: 02/20/24 10:07 AM   Specimen: BLOOD RIGHT FOREARM  Result Value Ref Range Status   Specimen Description   Final    BLOOD RIGHT FOREARM Performed at [facility], [address] [place], [place] [address]    Special Requests   Final    BOTTLES DRAWN AEROBIC AND ANAEROBIC Blood Culture results may not be optimal due to an inadequate volume of blood received in culture bottles Performed at [facility], [address] [place], [place] [address]    Culture   Final    NO GROWTH 3 DAYS Performed at [facility], [address] [place], [place] [address]    Report Status PENDING  Incomplete  Urine Culture     Status: Abnormal   Collection Time: 02/20/24 10:51 AM   Specimen: Urine, Random  Result Value Ref Range Status   Specimen Description   Final    URINE, RANDOM Performed at [facility], [address] [place], [place] [address]    Special Requests   Final    NONE Reflexed from [id number] Performed at [facility], [address], [place], [place] [address]    Culture (A)  Final    20,000 COLONIES/mL ESCHERICHIA COLI Confirmed Extended Spectrum Beta-Lactamase Producer (ESBL).  In bloodstream infections from ESBL organisms, carbapenems are preferred over piperacillin/tazobactam. They are shown to have a lower risk of mortality.    Report Status 02/22/2024 FINAL  Final   Organism ID, Bacteria  ESCHERICHIA COLI (A)  Final      Susceptibility   Escherichia coli - MIC*    AMPICILLIN >=32 RESISTANT Resistant     CEFAZOLIN >=64 RESISTANT Resistant     CEFEPIME  16 RESISTANT Resistant     CEFTRIAXONE  >=64 RESISTANT Resistant     CIPROFLOXACIN >=4 RESISTANT Resistant      GENTAMICIN >=16 RESISTANT Resistant     IMIPENEM <=0.25 SENSITIVE Sensitive     NITROFURANTOIN <=16 SENSITIVE Sensitive     TRIMETH/SULFA >=320 RESISTANT Resistant     AMPICILLIN/SULBACTAM 4 SENSITIVE Sensitive     PIP/TAZO <=4 SENSITIVE Sensitive ug/mL    * 20,000 COLONIES/mL ESCHERICHIA COLI     Time coordinating discharge: 35 minutes  SIGNED:   Audria Leather, MD  [facility] 02/23/2024, 9:57 AM

## 2024-02-23 NOTE — Plan of Care (Signed)
  Problem: Fluid Volume: Goal: Hemodynamic stability will improve Outcome: Completed/Met   Problem: Clinical Measurements: Goal: Diagnostic test results will improve Outcome: Completed/Met Goal: Signs and symptoms of infection will decrease Outcome: Completed/Met   Problem: Respiratory: Goal: Ability to maintain adequate ventilation will improve Outcome: Completed/Met   Problem: Education: Goal: Knowledge of General Education information will improve Description: Including pain rating scale, medication(s)/side effects and non-pharmacologic comfort measures Outcome: Completed/Met   Problem: Health Behavior/Discharge Planning: Goal: Ability to manage health-related needs will improve Outcome: Completed/Met   Problem: Clinical Measurements: Goal: Ability to maintain clinical measurements within normal limits will improve Outcome: Completed/Met Goal: Will remain free from infection Outcome: Completed/Met Goal: Diagnostic test results will improve Outcome: Completed/Met Goal: Respiratory complications will improve Outcome: Completed/Met Goal: Cardiovascular complication will be avoided Outcome: Completed/Met   Problem: Activity: Goal: Risk for activity intolerance will decrease Outcome: Completed/Met   Problem: Activity: Goal: Risk for activity intolerance will decrease Outcome: Completed/Met   Problem: Nutrition: Goal: Adequate nutrition will be maintained Outcome: Completed/Met   Problem: Coping: Goal: Level of anxiety will decrease Outcome: Completed/Met   Problem: Elimination: Goal: Will not experience complications related to bowel motility Outcome: Completed/Met Goal: Will not experience complications related to urinary retention Outcome: Completed/Met   Problem: Pain Managment: Goal: General experience of comfort will improve and/or be controlled Outcome: Completed/Met   Problem: Safety: Goal: Ability to remain free from injury will improve Outcome:  Completed/Met   Problem: Skin Integrity: Goal: Risk for impaired skin integrity will decrease Outcome: Completed/Met

## 2024-02-25 LAB — CULTURE, BLOOD (ROUTINE X 2)
Culture: NO GROWTH
Culture: NO GROWTH

## 2024-02-27 DIAGNOSIS — B9629 Other Escherichia coli [E. coli] as the cause of diseases classified elsewhere: Secondary | ICD-10-CM | POA: Diagnosis not present

## 2024-02-27 DIAGNOSIS — R972 Elevated prostate specific antigen [PSA]: Secondary | ICD-10-CM | POA: Diagnosis not present

## 2024-02-27 DIAGNOSIS — I10 Essential (primary) hypertension: Secondary | ICD-10-CM | POA: Diagnosis not present

## 2024-02-27 DIAGNOSIS — A4151 Sepsis due to Escherichia coli [E. coli]: Secondary | ICD-10-CM | POA: Diagnosis not present

## 2024-02-27 DIAGNOSIS — R413 Other amnesia: Secondary | ICD-10-CM | POA: Diagnosis not present

## 2024-02-27 DIAGNOSIS — Z1612 Extended spectrum beta lactamase (ESBL) resistance: Secondary | ICD-10-CM | POA: Diagnosis not present

## 2024-02-27 DIAGNOSIS — E782 Mixed hyperlipidemia: Secondary | ICD-10-CM | POA: Diagnosis not present

## 2024-02-27 DIAGNOSIS — N390 Urinary tract infection, site not specified: Secondary | ICD-10-CM | POA: Diagnosis not present

## 2024-03-01 DIAGNOSIS — R3915 Urgency of urination: Secondary | ICD-10-CM | POA: Diagnosis not present

## 2024-03-01 DIAGNOSIS — N486 Induration penis plastica: Secondary | ICD-10-CM | POA: Diagnosis not present

## 2024-03-01 DIAGNOSIS — N5201 Erectile dysfunction due to arterial insufficiency: Secondary | ICD-10-CM | POA: Diagnosis not present

## 2024-03-01 DIAGNOSIS — R972 Elevated prostate specific antigen [PSA]: Secondary | ICD-10-CM | POA: Diagnosis not present

## 2024-03-09 DIAGNOSIS — D72829 Elevated white blood cell count, unspecified: Secondary | ICD-10-CM | POA: Diagnosis not present

## 2024-03-09 DIAGNOSIS — E871 Hypo-osmolality and hyponatremia: Secondary | ICD-10-CM | POA: Diagnosis not present

## 2024-03-25 DIAGNOSIS — J309 Allergic rhinitis, unspecified: Secondary | ICD-10-CM | POA: Diagnosis not present

## 2024-03-25 DIAGNOSIS — E782 Mixed hyperlipidemia: Secondary | ICD-10-CM | POA: Diagnosis not present

## 2024-03-25 DIAGNOSIS — I10 Essential (primary) hypertension: Secondary | ICD-10-CM | POA: Diagnosis not present

## 2024-03-25 DIAGNOSIS — E1169 Type 2 diabetes mellitus with other specified complication: Secondary | ICD-10-CM | POA: Diagnosis not present

## 2024-03-25 DIAGNOSIS — D72829 Elevated white blood cell count, unspecified: Secondary | ICD-10-CM | POA: Diagnosis not present

## 2024-03-25 DIAGNOSIS — E559 Vitamin D deficiency, unspecified: Secondary | ICD-10-CM | POA: Diagnosis not present

## 2024-03-25 DIAGNOSIS — M85851 Other specified disorders of bone density and structure, right thigh: Secondary | ICD-10-CM | POA: Diagnosis not present

## 2024-03-25 DIAGNOSIS — N529 Male erectile dysfunction, unspecified: Secondary | ICD-10-CM | POA: Diagnosis not present

## 2024-03-25 DIAGNOSIS — R413 Other amnesia: Secondary | ICD-10-CM | POA: Diagnosis not present

## 2024-04-07 DIAGNOSIS — Z789 Other specified health status: Secondary | ICD-10-CM | POA: Diagnosis not present

## 2024-04-07 DIAGNOSIS — D1801 Hemangioma of skin and subcutaneous tissue: Secondary | ICD-10-CM | POA: Diagnosis not present

## 2024-04-07 DIAGNOSIS — L2989 Other pruritus: Secondary | ICD-10-CM | POA: Diagnosis not present

## 2024-04-07 DIAGNOSIS — L820 Inflamed seborrheic keratosis: Secondary | ICD-10-CM | POA: Diagnosis not present

## 2024-04-07 DIAGNOSIS — L538 Other specified erythematous conditions: Secondary | ICD-10-CM | POA: Diagnosis not present

## 2024-04-07 DIAGNOSIS — L821 Other seborrheic keratosis: Secondary | ICD-10-CM | POA: Diagnosis not present

## 2024-04-07 DIAGNOSIS — L814 Other melanin hyperpigmentation: Secondary | ICD-10-CM | POA: Diagnosis not present

## 2024-04-29 ENCOUNTER — Encounter: Payer: Self-pay | Admitting: Neurology

## 2024-04-29 ENCOUNTER — Ambulatory Visit (INDEPENDENT_AMBULATORY_CARE_PROVIDER_SITE_OTHER): Payer: Self-pay | Admitting: Neurology

## 2024-04-29 VITALS — BP 126/72 | HR 64 | Ht 66.0 in | Wt 166.0 lb

## 2024-04-29 DIAGNOSIS — E538 Deficiency of other specified B group vitamins: Secondary | ICD-10-CM

## 2024-04-29 DIAGNOSIS — R413 Other amnesia: Secondary | ICD-10-CM

## 2024-04-29 DIAGNOSIS — G3184 Mild cognitive impairment, so stated: Secondary | ICD-10-CM | POA: Diagnosis not present

## 2024-04-29 DIAGNOSIS — E519 Thiamine deficiency, unspecified: Secondary | ICD-10-CM

## 2024-04-29 NOTE — Progress Notes (Signed)
 [facility]    Provider:  Dr Ines Requesting Provider: [name], MD Primary Care Provider:  [name], MD  CC:  memory loss. Patient is here with his wife for referral for memory concerns. Wife states its been ongoing for years but is getting worse. Pt states it is just his short term memory. He said he cannot remember a location for a restaurant they have been to many times. He has misplaced keys.  If he's talked to someone in the family the next day he would forget that he talked to them.   HPI:  Samuel Huang is a 73 y.o. male here as urgently requested by [name], MD for memory loss. has Cough; Hypertension; Lymphocytosis; Sepsis secondary to UTI [facility]); GERD (gastroesophageal reflux disease); Mixed hyperlipidemia; Vitamin D deficiency; and BPH (benign prostatic hyperplasia) on their problem list. Admission 02/20/2024 for sepsis due to UTI and hyponatremia. Discharged 02/23/2024.  Per discharge summary Feb 23, 2024 Dr. Cheryle, 73 year old history of anxiety depression hyperlipidemia hypertension and BPH who underwent a prostate biopsy presented with hematuria and fever, UA was suggestive of UTI, white blood cells were elevated, culture grew resistant E. coli, he was treated with IV antibiotics, also hyponatremia, he was discharged in improving condition.   Wife provides musch information. Ongoing for years, progressively worsening, retired 10 years ago and wife noticed it 5-6 years ago, he forgets where common places are in [place], not getting lost, patient always drives not wife but she tells him where to go and forgets where a restaurant is that they frequent. Wife states he ahs spoken to their son the night before and the next day forgot. Wife hs been more mindful and noticed maybe it was just late at 930pm. He cooks and cleans and cuts the yard and takes care of many things. Patient states he has a riding lawnmower, and then goes to the shed and has a lock on  the fence and shed and forgets where the keys are so keeps the keys around the neck(compensates). Patient also takes care of the bills and nothing is late but all are on autopay. Wife woke him up one day and he was very irritable which is unusual it set him off for the morning although she did have her phone on high at 5am and he states that made him irritable. More short term memory, he can remember the past very well. He is meticulous and everyting is written down. Compensate. He sleeps well, no snoring or significant daytime fatigue. The kids are noticng his forgetfulness and patient is very sensitive about his memory. No problems with hearing. 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 He used to be very social and as he has retired he doesn't socialize as much except for the kids 45 and 38. They travel a lot, 9 rips last year but this year not as much.   Reviewed notes, labs and imaging from outside physicians, which showed: see above  Review of Systems: Patient complains of symptoms per HPI as well as the following symptoms per hpi. Pertinent negatives and positives per HPI. All others negative.   Social History   Socioeconomic History   Marital status: Married    Spouse name: Not on file   Number of children: 2   Years of education: Not on file   Highest education level:  Bachelor's degree (e.g., BA, AB, BS)  Occupational History   Occupation: Quarry manager: [employer]  Tobacco Use   Smoking status: Former    Current packs/day: 0.00    Average packs/day: 0.3 packs/day for 3.0 years (0.9 ttl pk-yrs)    Types: Cigarettes    Start date: 10/22/1975    Quit date: 10/21/1978    Years since quitting: 45.5   Smokeless tobacco: Never  Vaping Use   Vaping status: Never Used  Substance and Sexual Activity   Alcohol use: Yes    Comment: occ    Drug use: No   Sexual activity: Not on file  Other Topics Concern   Not on file  Social History Narrative   Retired   Right handed   Caffeine: decaf 2 cups/day   Social Drivers of Corporate investment banker Strain: Not on file  Food Insecurity: No Food Insecurity (02/20/2024)   Hunger Vital Sign    Worried About Running Out of Food in the Last Year: Never true    Ran Out of Food in the Last Year: Never true  Transportation Needs: No Transportation Needs (02/20/2024)   PRAPARE - Administrator, Civil Service (Medical): No    Lack of Transportation (Non-Medical): No  Physical Activity: Not on file  Stress: No Stress Concern Present (06/07/2021)   Received from [facility] of Occupational Health - Occupational Stress Questionnaire    Feeling of Stress : Not at all  Social Connections: Unknown (02/20/2024)   Social Connection and Isolation Panel    Frequency of Communication with Friends and Family: Three times a week    Frequency of Social Gatherings with Friends and Family: Three times a week    Attends Religious Services: Patient declined    Active Member of Clubs or Organizations: Patient declined    Attends Banker Meetings: Patient declined    Marital Status: Married  Catering manager Violence: Not At Risk (02/20/2024)   Humiliation, Afraid, Rape, and Kick questionnaire    Fear of Current or Ex-Partner: No    Emotionally Abused: No    Physically Abused: No    Sexually Abused: No    Family History  Problem Relation Age of Onset   Heart disease Mother    Diabetes Father    Diabetes Sister    Colon cancer Neg Hx    Stomach cancer Neg Hx    Rectal cancer Neg Hx    Esophageal cancer Neg Hx    Liver cancer Neg Hx    Dementia Neg Hx     Past Medical History:  Diagnosis Date   Anxiety    work related, pt states not anymore   BPH (benign prostatic hyperplasia)    Cataract    Depression    work related/ retired, pt states  not anymore   ED (erectile dysfunction)    GERD (gastroesophageal reflux disease)    Hemorrhoids    Hyperlipidemia    Hypertension    Insomnia    Osteopenia    Vitamin D deficiency     Patient Active Problem List   Diagnosis Date Noted   Sepsis secondary to UTI (HCC) 02/20/2024   GERD (gastroesophageal reflux disease) 02/20/2024   Mixed hyperlipidemia 02/20/2024   Vitamin D deficiency 02/20/2024   BPH (benign prostatic hyperplasia) 02/20/2024   Lymphocytosis 03/11/2022   Hypertension 02/12/2012   Cough 02/11/2012    Past Surgical History:  Procedure Laterality  Date   COLONOSCOPY     FINGER FRACTURE SURGERY Left    Pinky finger    Current Outpatient Medications  Medication Sig Dispense Refill   amLODipine  (NORVASC ) 2.5 MG tablet Take 2.5 mg by mouth at bedtime.     Ascorbic Acid (VITAMIN C) 500 MG CHEW Chew 500 mg by mouth daily. (Patient taking differently: Chew 500 mg by mouth every other day.)     carvedilol  (COREG ) 6.25 MG tablet Take 6.25 mg by mouth 2 (two) times daily with a meal.     Cholecalciferol (VITAMIN D3) 50 MCG (2000 UT) TABS Take 2,000 Units by mouth at bedtime.     fluticasone (FLONASE) 50 MCG/ACT nasal spray Place 2 sprays into both nostrils in the morning.     NON FORMULARY Take 2 tablets by mouth See admin instructions. Focus Factor- Take 2 tablets by mouth in the morning     sildenafil (REVATIO) 20 MG tablet Take 20-60 mg by mouth daily as needed (one hour prior to event).     simvastatin  (ZOCOR ) 20 MG tablet Take 20 mg by mouth at bedtime.     telmisartan (MICARDIS) 80 MG tablet Take 80 mg by mouth daily.     XIIDRA 5 % SOLN Place 1 drop into both eyes in the morning.     No current facility-administered medications for this visit.    Allergies as of 04/29/2024 - Review Complete 04/29/2024  Allergen Reaction Noted   Penicillins Rash 04/29/2008    Vitals: BP 126/72 (BP Location: Right Arm, Patient Position: Sitting, Cuff Size: Normal)    Pulse 64   Ht 5' 6 (1.676 m)   Wt 166 lb (75.3 kg)   BMI 26.79 kg/m  Last Weight:  Wt Readings from Last 1 Encounters:  04/29/24 166 lb (75.3 kg)   Last Height:   Ht Readings from Last 1 Encounters:  04/29/24 5' 6 (1.676 m)     Physical exam: Exam: Gen: NAD, conversant, well nourised, obese, well groomed                     CV: RRR, no MRG. No Carotid Bruits. No peripheral edema, warm, nontender Eyes: Conjunctivae clear without exudates or hemorrhage  Neuro: Detailed Neurologic Exam  Speech:    Speech is normal; fluent and spontaneous with normal comprehension.  Cognition:       No data to display          Cranial Nerves:    The pupils are equal, round, and reactive to light. The fundi are normal and spontaneous venous pulsations are present. Visual fields are full to finger confrontation. Extraocular movements are intact. Trigeminal sensation is intact and the muscles of mastication are normal. The face is symmetric. The palate elevates in the midline. Hearing intact. Voice is normal. Shoulder shrug is normal. The tongue has normal motion without fasciculations.   Coordination:    Normal finger to nose and heel to shin. Normal rapid alternating movements.   Gait:    Heel-toe and tandem gait are normal.   Motor Observation:    No asymmetry, no atrophy, and no involuntary movements noted. Tone:    Normal muscle tone.    Posture:    Posture is normal. normal erect    Strength:    Strength is V/V in the upper and lower limbs.      Sensation: intact to LT     Reflex Exam:  DTR's:    Deep tendon reflexes in the  upper and lower extremities are normal bilaterally.   Toes:    The toes are downgoing bilaterally.   Clonus:    Clonus is absent.    Assessment/Plan:  Patient with memory loss, mild cognitive impairment, needs thorough evaluation for etiology  -MRI of the brain w/wo contrast for reversible causes of dementia -Blood work for reversible causes  of memory loss and also biomarkers of alzheimer's dementia -If there are alzheimer's disease biomarkers we will order an specialized scan of the brain called an amyloid PET scan for the alzheimer's proteins in the brain and can discuss the new medications such as lecanemab and donanemab and also aricept/donepezil -Formal memory/neurocognitive testing -Then we get back together to discuss treatment -MIND diet, discussed as well as other strategies to slow down memory loss including hearing aids, socialization, management of medical problems, exercise  Orders Placed This Encounter  Procedures   MR BRAIN W WO CONTRAST   B12 and Folate Panel   Methylmalonic acid, serum   Vitamin B1   CBC with Differential/Platelets   Comprehensive metabolic panel with GFR   TSH Rfx on Abnormal to Free T4   ATN PROFILE   APOE Alzheimer's Risk   Ambulatory referral to Neuropsychology     Cc: [name], MD,  [name], MD  Onetha Epp, MD  [facility] [address] [place], [place] [address]  Phone [phone] Fax [phone]  I spent 63 minutes of face-to-face and non-face-to-face time with patient on the  1. Mild cognitive impairment   2. evaluate for B12 deficiency   3. evaluate for Vitamin B1 deficiency   4. Memory loss    diagnosis.  This included previsit chart review, lab review, study review, order entry, electronic health record documentation, patient education on the different diagnostic and therapeutic options, counseling and coordination of care, risks and benefits of management, compliance, or risk factor reduction

## 2024-04-29 NOTE — Patient Instructions (Addendum)
 -MRI of the brain w/wo contrast -Blood work for reversible causes of memory loss and also biomarkers of alzheimer's dementia -If there are alzheimer's disease biomarkers we will order an specialized scan of the brain called an amyloid PET scan for the alzheimer's proteins in the brain and can discuss the new medications such as lecanemab and donanemab and also aricept/donepezil -Formal memory/neurocognitive testing -Then we get back together to discuss treatment -MIND diet  Problems With Thinking and Memory (Mild Neurocognitive Disorder): What to Know Mild neurocognitive disorder, formerly known as mild cognitive impairment, is a disorder where your memory doesn't work as well as it should. It may also affect other mental abilities like thinking, communicating, behavior, and being able to finish tasks. These problems can be noticed and measured. But they usually don't stop you from doing daily activities or living on your own. Mild neurocognitive disorder usually happens after 73 years of age. But it can also happen at younger ages. It's not as serious as major neurocognitive disorder, also known as dementia, but it may be the first sign of it. In general, the symptoms of this condition get worse over time. In rare cases, symptoms can get better. What are the causes? This condition may be caused by: Brain disorders like Alzheimer's disease, Parkinson's disease, and other conditions that slowly damage nerve cells. Diseases that affect the blood vessels in the brain and cause small strokes. Certain infections, like HIV. Traumatic brain injury. Other medical conditions, such as brain tumors, underactive thyroid  (hypothyroidism), and not having enough vitamin B12. Using certain drugs or medicines. What increases the risk? Being older than 73 years of age. Being male. Having a lower level of education. Diabetes, high blood pressure, high cholesterol, and other conditions that raise the risk for  blood vessel diseases. Untreated or undertreated sleep apnea. Having a certain type of gene that can be inherited, or passed down from parent to child. Long-term health problems like heart disease, lung disease, liver disease, kidney disease, or depression. What are the signs or symptoms? Trouble remembering things. You may: Forget names, phone numbers, or details of recent events. Forget about social events and appointments. Often forget where you put your car keys or other items. Trouble thinking and solving problems. You may have trouble with complex tasks like: Paying bills. Driving in places you don't know well. Trouble communicating. You may have trouble: Finding the right word or naming an object. Forming a sentence that makes sense. Understanding what you read or hear. Changes in your behavior or personality. When this happens, you may: Lose interest in the things you used to enjoy. Avoid being around people. Get angry more easily than usual. Act before thinking. How is this diagnosed? This condition is diagnosed based on: Your symptoms. Your health care provider may ask you and the people you spend time with, like family and friends, about your symptoms. Memory tests and other tests to check how your brain is working. Your provider may refer you to a provider called a neurologist or a mental health specialist. To try to find out the cause of your condition, your provider may: Get a detailed medical history. Ask about use of alcohol, drugs, and medicines. Do a physical exam. Order blood tests and brain imaging tests. How is this treated? Mild neurocognitive disorder that's caused by medicine use, drug use, infection, or another medical condition may get better when the cause is treated, or when medicines or drugs are stopped. If this disorder has another cause, it  usually doesn't improve and may get worse. In these cases, the goal of treatment is to help you manage the  symptoms. This may include: Medicines to help with memory and behavior symptoms. Talk therapy. This provides education, emotional support, memory aids, and other ways of making up for problems with mental tasks. Lifestyle changes. These may include: Getting regular exercise. Eating a healthy diet that includes omega-3 fatty acids. Doing things to challenge your thinking and memory skills. Spending more time being with and talking to other people. Using routines like having regular times for meals and going to bed. Follow these instructions at home: Eating and drinking  Drink more fluids as told. Eat a healthy diet that includes omega-3 fatty acids. These can be found in: Fish. Nuts. Leafy vegetables. Vegetable oils. If you drink alcohol: Limit how much you have to: 0-1 drink a day if you're male. 0-2 drinks a day if you're male. Know how much alcohol is in your drink. In the [place] one drink is one 12 oz bottle of beer (355 mL), one 5 oz glass of wine (148 mL), or one 1 oz glass of hard liquor (44 mL). Lifestyle  Get regular exercise as told by your provider. Do not smoke, vape, or use nicotine or tobacco. Use healthy ways to manage stress. If you need help managing stress, ask your provider. Keep spending time with other people. Keep your mind active by doing activities you enjoy, like reading or playing games. Make sure you get good sleep at night. These tips can help: Try not to take naps during the day. Keep your bedroom dark and cool. Do not exercise in the few hours before you go to bed. Do not have foods or drinks with caffeine at night. General instructions Take medicines only as told. Your provider may tell you to avoid taking medicines that can affect thinking. These include some medicines for pain or sleeping. Work with your provider to find out: What things you need help with. What your safety needs are. Where to find more information [employer] on  Aging: [email or web address] Contact a health care provider if: You have any new symptoms. Get help right away if: You have new confusion or your confusion gets worse. You act in ways that put you or your family in danger. This information is not intended to replace advice given to you by your health care provider. Make sure you discuss any questions you have with your health care provider. Document Revised: 04/01/2023 Document Reviewed: 04/01/2023 Elsevier Patient Education  2024 [employer].

## 2024-04-30 LAB — TSH RFX ON ABNORMAL TO FREE T4: TSH: 1.59 u[IU]/mL (ref 0.450–4.500)

## 2024-05-02 ENCOUNTER — Encounter: Payer: Self-pay | Admitting: Neurology

## 2024-05-03 ENCOUNTER — Telehealth: Payer: Self-pay | Admitting: Neurology

## 2024-05-03 NOTE — Telephone Encounter (Signed)
 no auth required sent to [facility] [phone]

## 2024-05-03 NOTE — Telephone Encounter (Signed)
 Referral for neuropsychology fax to [facility]. Phone: [phone], Fax; [phone].

## 2024-05-05 ENCOUNTER — Ambulatory Visit: Payer: Self-pay | Admitting: Neurology

## 2024-05-07 LAB — CBC WITH DIFFERENTIAL/PLATELET
Basophils Absolute: 0 x10E3/uL (ref 0.0–0.2)
Basos: 0 %
EOS (ABSOLUTE): 0.1 x10E3/uL (ref 0.0–0.4)
Eos: 1 %
Hematocrit: 46.7 % (ref 37.5–51.0)
Hemoglobin: 15 g/dL (ref 13.0–17.7)
Immature Grans (Abs): 0 x10E3/uL (ref 0.0–0.1)
Immature Granulocytes: 0 %
Lymphocytes Absolute: 5.5 x10E3/uL — ABNORMAL HIGH (ref 0.7–3.1)
Lymphs: 46 %
MCH: 31.1 pg (ref 26.6–33.0)
MCHC: 32.1 g/dL (ref 31.5–35.7)
MCV: 97 fL (ref 79–97)
Monocytes Absolute: 0.8 x10E3/uL (ref 0.1–0.9)
Monocytes: 7 %
Neutrophils Absolute: 5.5 x10E3/uL (ref 1.4–7.0)
Neutrophils: 46 %
Platelets: 282 x10E3/uL (ref 150–450)
RBC: 4.83 x10E6/uL (ref 4.14–5.80)
RDW: 12.1 % (ref 11.6–15.4)
WBC: 12 x10E3/uL — ABNORMAL HIGH (ref 3.4–10.8)

## 2024-05-07 LAB — COMPREHENSIVE METABOLIC PANEL WITH GFR
ALT: 13 IU/L (ref 0–44)
AST: 23 IU/L (ref 0–40)
Albumin: 4.5 g/dL (ref 3.8–4.8)
Alkaline Phosphatase: 62 IU/L (ref 44–121)
BUN/Creatinine Ratio: 16 (ref 10–24)
BUN: 14 mg/dL (ref 8–27)
Bilirubin Total: 0.6 mg/dL (ref 0.0–1.2)
CO2: 22 mmol/L (ref 20–29)
Calcium: 9.5 mg/dL (ref 8.6–10.2)
Chloride: 98 mmol/L (ref 96–106)
Creatinine, Ser: 0.85 mg/dL (ref 0.76–1.27)
Globulin, Total: 2.6 g/dL (ref 1.5–4.5)
Glucose: 111 mg/dL — ABNORMAL HIGH (ref 70–99)
Potassium: 4.6 mmol/L (ref 3.5–5.2)
Sodium: 136 mmol/L (ref 134–144)
Total Protein: 7.1 g/dL (ref 6.0–8.5)
eGFR: 92 mL/min/1.73 (ref >59)

## 2024-05-07 LAB — APOE ALZHEIMER'S RISK

## 2024-05-07 LAB — ATN PROFILE
A -- Beta-amyloid 42/40 Ratio: 0.113 (ref >0.102)
Beta-amyloid 40: 165.35 pg/mL
Beta-amyloid 42: 18.67 pg/mL
N -- NfL, Plasma: 1.94 pg/mL (ref 0.00–6.04)
T -- p-tau181: 0.66 pg/mL (ref 0.00–0.97)

## 2024-05-07 LAB — METHYLMALONIC ACID, SERUM: Methylmalonic Acid: 117 nmol/L (ref 0–378)

## 2024-05-07 LAB — VITAMIN B1: Thiamine: 156.4 nmol/L (ref 66.5–200.0)

## 2024-05-07 LAB — B12 AND FOLATE PANEL
Folate: 19.6 ng/mL (ref >3.0)
Vitamin B-12: 773 pg/mL (ref 232–1245)

## 2024-05-11 NOTE — Telephone Encounter (Signed)
 Received fax message from [facility] on 05/07/24: Thank you for your kind referral. Your patient has been scxheduled to see Dr. Authur on the following dates:  Intake/testing: July 20, 2024, at 9:00 am Interview/Feedback: August 04, 2024, at 10:00 am The report will be sent to you following the Feedback appointment. Please provide additional information:  Last 3 Clinic Notes CT/MRI Brain Results (if available) H&P Medications Please fax above documents to [phone] Please contact our office with any questions. Thank you

## 2024-05-12 ENCOUNTER — Ambulatory Visit

## 2024-05-12 DIAGNOSIS — Z13820 Encounter for screening for osteoporosis: Secondary | ICD-10-CM

## 2024-05-12 DIAGNOSIS — M85851 Other specified disorders of bone density and structure, right thigh: Secondary | ICD-10-CM

## 2024-05-13 ENCOUNTER — Other Ambulatory Visit: Payer: Medicare Other

## 2024-05-14 ENCOUNTER — Other Ambulatory Visit

## 2024-06-01 ENCOUNTER — Ambulatory Visit
Admission: RE | Admit: 2024-06-01 | Discharge: 2024-06-01 | Disposition: A | Discharge status: Home / Self Care | Source: Ambulatory Visit | Attending: Neurology | Admitting: Neurology

## 2024-06-01 DIAGNOSIS — G3184 Mild cognitive impairment, so stated: Secondary | ICD-10-CM | POA: Diagnosis not present

## 2024-06-01 DIAGNOSIS — R413 Other amnesia: Secondary | ICD-10-CM

## 2024-06-01 MED ORDER — GADOPICLENOL 0.5 MMOL/ML IV SOLN
7.5000 mL | Freq: Once | INTRAVENOUS | Status: AC | PRN
  Administered 2024-06-01 (×2): 7.5 mL via INTRAVENOUS

## 2024-06-24 ENCOUNTER — Inpatient Hospital Stay: Attending: Internal Medicine

## 2024-06-24 ENCOUNTER — Inpatient Hospital Stay (HOSPITAL_BASED_OUTPATIENT_CLINIC_OR_DEPARTMENT_OTHER): Admitting: Internal Medicine

## 2024-06-24 VITALS — BP 108/61 | HR 98 | Temp 97.6°F | Resp 17 | Ht 66.0 in | Wt 168.0 lb

## 2024-06-24 DIAGNOSIS — E559 Vitamin D deficiency, unspecified: Secondary | ICD-10-CM | POA: Insufficient documentation

## 2024-06-24 DIAGNOSIS — D72828 Other elevated white blood cell count: Secondary | ICD-10-CM | POA: Diagnosis not present

## 2024-06-24 DIAGNOSIS — R739 Hyperglycemia, unspecified: Secondary | ICD-10-CM | POA: Insufficient documentation

## 2024-06-24 DIAGNOSIS — E785 Hyperlipidemia, unspecified: Secondary | ICD-10-CM | POA: Diagnosis not present

## 2024-06-24 DIAGNOSIS — D72820 Lymphocytosis (symptomatic): Secondary | ICD-10-CM

## 2024-06-24 DIAGNOSIS — M8580 Other specified disorders of bone density and structure, unspecified site: Secondary | ICD-10-CM | POA: Diagnosis not present

## 2024-06-24 DIAGNOSIS — N400 Enlarged prostate without lower urinary tract symptoms: Secondary | ICD-10-CM | POA: Diagnosis not present

## 2024-06-24 DIAGNOSIS — K219 Gastro-esophageal reflux disease without esophagitis: Secondary | ICD-10-CM | POA: Diagnosis not present

## 2024-06-24 DIAGNOSIS — N529 Male erectile dysfunction, unspecified: Secondary | ICD-10-CM | POA: Diagnosis not present

## 2024-06-24 DIAGNOSIS — Z79899 Other long term (current) drug therapy: Secondary | ICD-10-CM | POA: Insufficient documentation

## 2024-06-24 DIAGNOSIS — I10 Essential (primary) hypertension: Secondary | ICD-10-CM | POA: Insufficient documentation

## 2024-06-24 LAB — CBC WITH DIFFERENTIAL (CANCER CENTER ONLY)
Abs Immature Granulocytes: 0.02 K/uL (ref 0.00–0.07)
Basophils Absolute: 0.1 K/uL (ref 0.0–0.1)
Basophils Relative: 1 %
Eosinophils Absolute: 0.1 K/uL (ref 0.0–0.5)
Eosinophils Relative: 1 %
HCT: 45.2 % (ref 39.0–52.0)
Hemoglobin: 15 g/dL (ref 13.0–17.0)
Immature Granulocytes: 0 %
Lymphocytes Relative: 43 %
Lymphs Abs: 5 K/uL — ABNORMAL HIGH (ref 0.7–4.0)
MCH: 31 pg (ref 26.0–34.0)
MCHC: 33.2 g/dL (ref 30.0–36.0)
MCV: 93.4 fL (ref 80.0–100.0)
Monocytes Absolute: 0.6 K/uL (ref 0.1–1.0)
Monocytes Relative: 5 %
Neutro Abs: 5.8 K/uL (ref 1.7–7.7)
Neutrophils Relative %: 50 %
Platelet Count: 275 K/uL (ref 150–400)
RBC: 4.84 MIL/uL (ref 4.22–5.81)
RDW: 12.6 % (ref 11.5–15.5)
Smear Review: NORMAL
WBC Count: 11.5 K/uL — ABNORMAL HIGH (ref 4.0–10.5)
nRBC: 0 % (ref 0.0–0.2)

## 2024-06-24 LAB — CMP (CANCER CENTER ONLY)
ALT: 24 U/L (ref 0–44)
AST: 31 U/L (ref 15–41)
Albumin: 4.3 g/dL (ref 3.5–5.0)
Alkaline Phosphatase: 53 U/L (ref 38–126)
Anion gap: 4 — ABNORMAL LOW (ref 5–15)
BUN: 16 mg/dL (ref 8–23)
CO2: 33 mmol/L — ABNORMAL HIGH (ref 22–32)
Calcium: 9.9 mg/dL (ref 8.9–10.3)
Chloride: 99 mmol/L (ref 98–111)
Creatinine: 0.97 mg/dL (ref 0.61–1.24)
GFR, Estimated: >60 mL/min (ref >60)
Glucose, Bld: 201 mg/dL — ABNORMAL HIGH (ref 70–99)
Potassium: 4.4 mmol/L (ref 3.5–5.1)
Sodium: 136 mmol/L (ref 135–145)
Total Bilirubin: 0.6 mg/dL (ref 0.0–1.2)
Total Protein: 7.7 g/dL (ref 6.5–8.1)

## 2024-06-24 LAB — LACTATE DEHYDROGENASE: LDH: 115 U/L (ref 98–192)

## 2024-06-24 NOTE — Progress Notes (Signed)
 [facility] [phone]   [phone]  OFFICE PROGRESS NOTE  [name], MD [phone] [address] [place] [address]  DIAGNOSIS: Lymphocytosis likely B cell lymphoproliferative disorder    PRIOR THERAPY: None   CURRENT THERAPY: Observation   INTERVAL HISTORY: Samuel Huang 73 y.o. male returns to the clinic today for 84-month follow-up visit. Discussed the use of AI scribe software for clinical note transcription with the patient, who gave verbal consent to proceed.  History of Present Illness Samuel Huang is a 73 year old male with lymphocytosis who presents for evaluation and repeat blood work.  He has no new issues or complaints since his last visit six months ago. No swelling of glands, weight loss, or night sweats. He is currently under observation for his lymphocytosis and has not reported any significant changes in his condition.  Current laboratory results show a white blood cell count of 11.5, consistent with previous results of 12 and 11.4. His lymphocyte count is 5000, slightly above the normal range. His blood sugar level is 201, which is elevated, but he had eaten prior to the test.  He mentions traveling extensively, including recent trips to [place], [place], and [place].    MEDICAL HISTORY: Past Medical History:  Diagnosis Date   Anxiety    work related, pt states not anymore   BPH (benign prostatic hyperplasia)    Cataract    Depression    work related/ retired, pt states not anymore   ED (erectile dysfunction)    GERD (gastroesophageal reflux disease)    Hemorrhoids    Hyperlipidemia    Hypertension    Insomnia    Osteopenia    Vitamin D deficiency     ALLERGIES:  is allergic to penicillins.  MEDICATIONS:  Current Outpatient Medications  Medication Sig Dispense Refill   amLODipine  (NORVASC ) 2.5 MG tablet Take 2.5 mg by mouth at bedtime.     Ascorbic Acid (VITAMIN C) 500 MG CHEW Chew 500 mg  by mouth daily. (Patient taking differently: Chew 500 mg by mouth every other day.)     carvedilol  (COREG ) 6.25 MG tablet Take 6.25 mg by mouth 2 (two) times daily with a meal.     Cholecalciferol (VITAMIN D3) 50 MCG (2000 UT) TABS Take 2,000 Units by mouth at bedtime.     fluticasone (FLONASE) 50 MCG/ACT nasal spray Place 2 sprays into both nostrils in the morning.     NON FORMULARY Take 2 tablets by mouth See admin instructions. Focus Factor- Take 2 tablets by mouth in the morning     sildenafil (REVATIO) 20 MG tablet Take 20-60 mg by mouth daily as needed (one hour prior to event).     simvastatin  (ZOCOR ) 20 MG tablet Take 20 mg by mouth at bedtime.     telmisartan (MICARDIS) 80 MG tablet Take 80 mg by mouth daily.     XIIDRA 5 % SOLN Place 1 drop into both eyes in the morning.     No current facility-administered medications for this visit.    SURGICAL HISTORY:  Past Surgical History:  Procedure Laterality Date   COLONOSCOPY     FINGER FRACTURE SURGERY Left    Pinky finger    REVIEW OF SYSTEMS:  A comprehensive review of systems was negative.   PHYSICAL EXAMINATION: General appearance: alert, cooperative, and no distress Head: Normocephalic, without obvious abnormality, atraumatic Neck: no adenopathy, no JVD, supple, symmetrical, trachea midline, and thyroid  not  enlarged, symmetric, no tenderness/mass/nodules Lymph nodes: Cervical, supraclavicular, and axillary nodes normal. Resp: clear to auscultation bilaterally Back: symmetric, no curvature. ROM normal. No CVA tenderness. Cardio: regular rate and rhythm, S1, S2 normal, no murmur, click, rub or gallop GI: soft, non-tender; bowel sounds normal; no masses,  no organomegaly Extremities: extremities normal, atraumatic, no cyanosis or edema  ECOG PERFORMANCE STATUS: 0 - Asymptomatic  Blood pressure 108/61, pulse 98, temperature 97.6 F (36.4 C), temperature source Temporal, resp. rate 17, height 5' 6 (1.676 m), weight 168 lb  (76.2 kg), SpO2 100%.  LABORATORY DATA: Lab Results  Component Value Date   WBC 11.5 (H) 06/24/2024   HGB 15.0 06/24/2024   HCT 45.2 06/24/2024   MCV 93.4 06/24/2024   PLT 275 06/24/2024      Chemistry      Component Value Date/Time   NA 136 04/29/2024 1208   K 4.6 04/29/2024 1208   CL 98 04/29/2024 1208   CO2 22 04/29/2024 1208   BUN 14 04/29/2024 1208   CREATININE 0.85 04/29/2024 1208   CREATININE 1.08 12/23/2023 1440      Component Value Date/Time   CALCIUM 9.5 04/29/2024 1208   ALKPHOS 62 04/29/2024 1208   AST 23 04/29/2024 1208   AST 24 12/23/2023 1440   ALT 13 04/29/2024 1208   ALT 22 12/23/2023 1440   BILITOT 0.6 04/29/2024 1208   BILITOT 0.5 12/23/2023 1440       RADIOGRAPHIC STUDIES: MR BRAIN W WO CONTRAST Result Date: 06/02/2024 Table formatting from the original result was not included. [facility] NEUROIMAGING REPORT STUDY DATE: 06/01/24 PATIENT NAME: Samuel Huang DOB: [birth year] MRN: [id number] ORDERING CLINICIAN: Ines Onetha NOVAK, MD CLINICAL HISTORY: 73 y.o. year old male with: 1. Mild cognitive impairment  2. Memory loss   EXAM: MR BRAIN W WO CONTRAST TECHNIQUE: MRI of the brain with and without contrast was obtained utilizing multiplanar, multiecho pulse sequences. CONTRAST: Diagnostic Product Medications (last 72 hours)   Date/Time Action Medication Dose  06/01/24 1042 Contrast Given  gadopiclenol  (VUEWAY ) 0.5 MMOL/ML solution 7.5 mL 7.5 mL   COMPARISON: none IMAGING SITE: [facility]  MRI Imaging [address] [place] [address] FINDINGS: No abnormal lesions are seen on diffusion-weighted views to suggest acute ischemia. The cortical sulci, fissures and cisterns are normal in size and appearance. Lateral, third and fourth ventricle are normal in size and appearance. No extra-axial fluid collections are seen. No evidence of mass effect or midline shift.  Mild periventricular and subcortical foci of  nonspecific T2 hyperintensities.  No abnormal lesions are seen on post contrast views.  On sagittal views the posterior fossa, pituitary gland and corpus callosum are unremarkable. No evidence of intracranial hemorrhage on SWI views. The orbits and their contents, paranasal sinuses and calvarium are notable for bilateral lens extractions and mild mucous thickening of the ethmoid sinuses.  Intracranial flow voids are present.   MRI brain (with and without) demonstrating: - Mild periventricular and subcortical foci of mild chronic small vessel ischemic disease. - No acute findings. INTERPRETING PHYSICIAN: EDUARD FABIENE HANLON, MD Certified in Neurology, Neurophysiology and Neuroimaging [facility] [address], [address] [place], [place] [address] [phone]   ASSESSMENT AND PLAN: This is a very pleasant 73 years old white male with persistent lymphocytosis suspicious for underlying myeloproliferative disorder. The patient is currently on observation and he is feeling fine. Repeat CBC today showed persistent mild elevation of the total white blood count as  well as lymphocytosis. Assessment and Plan Assessment & Plan Lymphocytosis, likely B cell lymphoproliferative disorder under observation Lymphocytosis remains well-managed with a white blood count of 11.5, previously 12 and 11.4. Lymphocytes are at 5000, which is borderline high as normal is up to 4000. No new symptoms such as weight loss, night sweats, or lymphadenopathy reported. - Continue observation - Schedule follow-up in six months  Elevated blood glucose (non-fasting) Blood glucose level is 201, elevated but non-fasting. Discussed the importance of monitoring blood glucose levels with primary care physician to rule out diabetes, especially given family history. - Monitor blood glucose levels with primary care physician The patient was advised to call immediately if he has any concerning symptoms in the interval.  The  patient voices understanding of current disease status and treatment options and is in agreement with the current care plan.  All questions were answered. The patient knows to call the clinic with any problems, questions or concerns. We can certainly see the patient much sooner if necessary.  The total time spent in the appointment was 20 minutes.  Disclaimer: This note was dictated with voice recognition software. Similar sounding words can inadvertently be transcribed and may not be corrected upon review.

## 2024-07-02 DIAGNOSIS — Z23 Encounter for immunization: Secondary | ICD-10-CM | POA: Diagnosis not present

## 2024-07-06 ENCOUNTER — Telehealth: Payer: Self-pay | Admitting: Neurology

## 2024-07-06 NOTE — Telephone Encounter (Signed)
 Patient rescheduled appointment due to a scheduling conflict

## 2024-07-20 DIAGNOSIS — R413 Other amnesia: Secondary | ICD-10-CM | POA: Diagnosis not present

## 2024-08-04 DIAGNOSIS — R413 Other amnesia: Secondary | ICD-10-CM | POA: Diagnosis not present

## 2024-08-04 NOTE — Telephone Encounter (Signed)
 [facility] called and asked for recent labs and MRI. I faxed MRI from August and all labs from September and July.

## 2024-08-05 ENCOUNTER — Telehealth: Payer: Self-pay | Admitting: Neurology

## 2024-08-05 NOTE — Telephone Encounter (Signed)
 Patient's wife, Delys Boike said neuropsychologist, Dr. Jenna Renfroe advised to get a Amyloid PET Scan due to memory loss. Would like to discuss if can get Dr. Buck ordering scan and an earlier appointment as soon as possible.

## 2024-08-05 NOTE — Telephone Encounter (Signed)
 LMVM for Dr. Claudis office to return call about PETSCAN.

## 2024-08-05 NOTE — Telephone Encounter (Signed)
 Received call back from Dr. Claudis office.  The report is not finished yet.  Looks like recommending pet scan.  They will get tos when finished.  When done will forward to Dr. Buck who is new neurologist ins practice.  Pt currently has appt 12-07-2023 at 1315.

## 2024-08-06 DIAGNOSIS — Z23 Encounter for immunization: Secondary | ICD-10-CM | POA: Diagnosis not present

## 2024-08-09 ENCOUNTER — Other Ambulatory Visit: Payer: Self-pay | Admitting: Neurology

## 2024-08-09 DIAGNOSIS — G309 Alzheimer's disease, unspecified: Secondary | ICD-10-CM

## 2024-08-09 DIAGNOSIS — R413 Other amnesia: Secondary | ICD-10-CM

## 2024-08-09 DIAGNOSIS — G3184 Mild cognitive impairment, so stated: Secondary | ICD-10-CM

## 2024-08-09 NOTE — Telephone Encounter (Signed)
 Noted

## 2024-08-09 NOTE — Telephone Encounter (Signed)
 Pt wife called to follow up about  Pt getting PET Scan  done the patient wife states she has not heard back from Nurse and wanted to follow up . Informed Pt wife  of last Note . Pt wife will wait on call from Nurse

## 2024-08-09 NOTE — Telephone Encounter (Signed)
 Spoke to wife  Per wife patient has completed neuropsyche  consult with Dr Shila at [facility] and is recommending PET Scan . Dr Vear will order  today (work in [place] for [place]) Wife aware . Informed may take 7-14 business days for insurance approval for scan wife will call us  back after scan is complete to be placed on Dr Buck waitlist for sooner appointment to discuss results of tests  Wife expressed understanding and thanked me for calling .

## 2024-08-16 ENCOUNTER — Encounter (HOSPITAL_COMMUNITY)
Admission: RE | Admit: 2024-08-16 | Discharge: 2024-08-16 | Disposition: A | Discharge status: Home / Self Care | Source: Ambulatory Visit | Attending: Neurology | Admitting: Neurology

## 2024-08-16 DIAGNOSIS — G309 Alzheimer's disease, unspecified: Secondary | ICD-10-CM | POA: Insufficient documentation

## 2024-08-16 DIAGNOSIS — G319 Degenerative disease of nervous system, unspecified: Secondary | ICD-10-CM | POA: Diagnosis not present

## 2024-08-16 DIAGNOSIS — F0390 Unspecified dementia without behavioral disturbance: Secondary | ICD-10-CM | POA: Diagnosis not present

## 2024-08-16 MED ORDER — FLORBETAPIR F 18 500-1900 MBQ/ML IV SOLN
10.1000 | Freq: Once | INTRAVENOUS | Status: AC
Start: 2024-08-16 — End: 2024-08-16
  Administered 2024-08-16: 10.1 via INTRAVENOUS

## 2024-08-17 ENCOUNTER — Ambulatory Visit: Payer: Self-pay | Admitting: Neurology

## 2024-08-23 NOTE — Telephone Encounter (Signed)
 Pt wife called to see if she can speak to MD about getting Pt seen sooner , Pt wife would like  to speak about  results and also Changing Provider the patient  wife would like  Pt to see Dr. Chalice

## 2024-08-25 NOTE — Telephone Encounter (Signed)
 I contacted pt/spouse, she stated she would like to be seen sooner since pt has completed all testing recommended by Dr Ines. She would like him to be established with Dr Chalice due to personal family/friends also being a pt of Dr Chalice and recommenced her. I advised her I will send request to both MD's for review and then when can discuss him being r/s. She is aware to expect a call back by Monday or Tuesday next week since both MD's are out of office. She verbally understood and was appreciative.

## 2024-08-26 NOTE — Telephone Encounter (Signed)
 He is not established with me. I am fine with it.

## 2024-09-22 ENCOUNTER — Ambulatory Visit: Admitting: Neurology

## 2024-10-04 DIAGNOSIS — Z1331 Encounter for screening for depression: Secondary | ICD-10-CM | POA: Diagnosis not present

## 2024-10-04 DIAGNOSIS — I10 Essential (primary) hypertension: Secondary | ICD-10-CM | POA: Diagnosis not present

## 2024-10-04 DIAGNOSIS — E1169 Type 2 diabetes mellitus with other specified complication: Secondary | ICD-10-CM | POA: Diagnosis not present

## 2024-10-04 DIAGNOSIS — R8290 Unspecified abnormal findings in urine: Secondary | ICD-10-CM | POA: Diagnosis not present

## 2024-10-04 DIAGNOSIS — E559 Vitamin D deficiency, unspecified: Secondary | ICD-10-CM | POA: Diagnosis not present

## 2024-10-04 DIAGNOSIS — R413 Other amnesia: Secondary | ICD-10-CM | POA: Diagnosis not present

## 2024-10-04 DIAGNOSIS — D72829 Elevated white blood cell count, unspecified: Secondary | ICD-10-CM | POA: Diagnosis not present

## 2024-10-04 DIAGNOSIS — G3184 Mild cognitive impairment, so stated: Secondary | ICD-10-CM | POA: Diagnosis not present

## 2024-10-04 DIAGNOSIS — E782 Mixed hyperlipidemia: Secondary | ICD-10-CM | POA: Diagnosis not present

## 2024-10-04 DIAGNOSIS — M85851 Other specified disorders of bone density and structure, right thigh: Secondary | ICD-10-CM | POA: Diagnosis not present

## 2024-10-04 DIAGNOSIS — N529 Male erectile dysfunction, unspecified: Secondary | ICD-10-CM | POA: Diagnosis not present

## 2024-10-04 DIAGNOSIS — J309 Allergic rhinitis, unspecified: Secondary | ICD-10-CM | POA: Diagnosis not present

## 2024-10-04 DIAGNOSIS — Z0000 Encounter for general adult medical examination without abnormal findings: Secondary | ICD-10-CM | POA: Diagnosis not present

## 2024-11-22 ENCOUNTER — Ambulatory Visit: Admitting: Neurology

## 2024-12-06 ENCOUNTER — Ambulatory Visit: Admitting: Neurology

## 2024-12-27 ENCOUNTER — Ambulatory Visit: Admitting: Internal Medicine

## 2024-12-27 ENCOUNTER — Other Ambulatory Visit

## 2025-01-13 ENCOUNTER — Ambulatory Visit: Admitting: Neurology
